# Patient Record
Sex: Female | Born: 1950 | Race: Black or African American | Hispanic: No | Smoking: Never smoker
Health system: Southern US, Community
[De-identification: ages and names within clinical notes are randomized; demographics above are authoritative.]

## PROBLEM LIST (undated history)

## (undated) DIAGNOSIS — I10 Essential (primary) hypertension: Secondary | ICD-10-CM

## (undated) DIAGNOSIS — M1990 Unspecified osteoarthritis, unspecified site: Secondary | ICD-10-CM

## (undated) DIAGNOSIS — M797 Fibromyalgia: Secondary | ICD-10-CM

## (undated) DIAGNOSIS — K219 Gastro-esophageal reflux disease without esophagitis: Secondary | ICD-10-CM

## (undated) HISTORY — DX: Essential (primary) hypertension: I10

## (undated) HISTORY — DX: Fibromyalgia: M79.7

## (undated) HISTORY — DX: Unspecified osteoarthritis, unspecified site: M19.90

## (undated) HISTORY — DX: Gastro-esophageal reflux disease without esophagitis: K21.9

---

## 2013-08-15 ENCOUNTER — Ambulatory Visit (INDEPENDENT_AMBULATORY_CARE_PROVIDER_SITE_OTHER): Payer: 59 | Admitting: Adult Health

## 2013-08-15 ENCOUNTER — Encounter: Payer: Self-pay | Admitting: Adult Health

## 2013-08-15 VITALS — BP 108/68 | HR 87 | Temp 98.5°F | Resp 14 | Ht 66.3 in | Wt 138.0 lb

## 2013-08-15 DIAGNOSIS — K219 Gastro-esophageal reflux disease without esophagitis: Secondary | ICD-10-CM

## 2013-08-15 DIAGNOSIS — L819 Disorder of pigmentation, unspecified: Secondary | ICD-10-CM | POA: Insufficient documentation

## 2013-08-15 DIAGNOSIS — I10 Essential (primary) hypertension: Secondary | ICD-10-CM

## 2013-08-15 MED ORDER — LOSARTAN POTASSIUM 50 MG PO TABS: 50.0000 mg | ORAL_TABLET | Freq: Every day | ORAL | Status: DC

## 2013-08-15 MED ORDER — AMLODIPINE BESYLATE 5 MG PO TABS: 5.0000 mg | ORAL_TABLET | Freq: Every day | ORAL | Status: DC

## 2013-08-15 MED ORDER — TRAZODONE HCL 50 MG PO TABS: 50.0000 mg | ORAL_TABLET | Freq: Every day | ORAL | Status: DC

## 2013-08-15 NOTE — Progress Notes (Signed)
Patient ID: Colleen Hogan, female   DOB: January 03, 1951, 63 y.o.   MRN: [id number]    Subjective:    Patient ID: Colleen Hogan, female    DOB: January 03, 1951, 63 y.o.   MRN: [id number]  HPI  Pt is a 63 y/o female who presents to clinic to establish care. She was followed by Dr. Alison MurrayAndreassi with [facility]. Will request records. Pt has an area on her right elbow that is dry and dark. She has been applying lotion but does not improve. She reports some itchiness in the area. Skin is intact.  Mammogram - 2014 [facility] PAP - 2010 Colonoscopy - Has never had one done and does not want one.    Past Medical History  Diagnosis Date  . Arthritis   . GERD (gastroesophageal reflux disease)   . Hypertension   . Fibromyalgia      History reviewed. No pertinent past surgical history.   Family History  Problem Relation Age of Onset  . Cancer Mother     Breast cancer  . Hypertension Mother   . Kidney disease Mother     Dialysis x 20 years  . Alcohol abuse Father   . Arthritis Father      History   Social History  . Marital Status: Divorced    Spouse Name: N/A    Number of Children: 1  . Years of Education: 3318   Occupational History  . 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 Smoking status: Never Smoker   . Smokeless tobacco: Not on file  . Alcohol Use: Yes     Comment: Rarely  . Drug Use: No  . Sexual Activity: Not on file   Other Topics Concern  . Not on file   Social History Narrative   Colleen Hogan grew up in [place], [place]. She is divorced and has one son, Christiane HaJonathan, and a granddaughter, Dorathy DaftKayla. They live in [place]. She lives in [place] with her significant other, Jule Economyndre Akins. They have a dog named Barnie. 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 She enjoys oil paint by numbers, reading, cooking.      Exercise - Elliptical    Caffeine - 1 cup of coffee in the morning                    1.5 glasses of ice tea daily          Review of Systems  Constitutional: Negative.   HENT: Negative.   Eyes: Negative.   Respiratory: Negative.   Cardiovascular: Negative.   Gastrointestinal: Negative.   Endocrine: Negative.   Genitourinary: Negative.   Musculoskeletal: Negative.   Skin:       Right elbow skin discoloration  Allergic/Immunologic: Negative.   Neurological: Negative.   Hematological: Negative.   Psychiatric/Behavioral: Negative.        Objective:     Physical Exam  Constitutional: She is oriented to person, place, and time. No distress.  HENT:  Head: Normocephalic and atraumatic.  Eyes: Conjunctivae and EOM are normal.  Neck: Normal range of motion. Neck supple.  Cardiovascular: Normal rate, regular rhythm, normal heart sounds and intact distal pulses.  Exam reveals no gallop and no friction rub.   No murmur heard. Pulmonary/Chest: Effort normal and breath sounds normal. No respiratory distress. She has no wheezes. She has no rales.  Musculoskeletal: Normal range of motion.  Neurological: She is alert and  oriented to person, place, and time. She has normal reflexes. Coordination normal.  Skin: Skin is warm and dry.  Right elbow skin dry, dark patch.   Psychiatric: She has a normal mood and affect. Her behavior is normal. Judgment and thought content normal.       Assessment & Plan:   1. HTN (hypertension) Well controlled on medication. Continue to follow  2. GERD (gastroesophageal reflux disease) Take TUMS or rolaids with good results. Symptoms have improved significantly since she retired.  3. Discoloration of skin Refer to Dermatology to further evaluate. - Ambulatory referral to Dermatology

## 2013-08-15 NOTE — Patient Instructions (Signed)
  Thank you for choosing [facility] for your health care needs.  Please remember to activate your MyChart account. The activation code is located at the end of this form.  I am referring you to Dermatology for your right elbow.  Please schedule your physical exam at your earliest convenience.

## 2013-08-15 NOTE — Progress Notes (Signed)
Pre visit review using our clinic review tool, if applicable. No additional management support is needed unless otherwise documented below in the visit note. 

## 2013-08-16 ENCOUNTER — Telehealth: Payer: Self-pay | Admitting: Adult Health

## 2013-08-16 NOTE — Telephone Encounter (Signed)
Relevant patient education assigned to patient using Emmi. ° °

## 2013-08-19 ENCOUNTER — Encounter: Payer: Self-pay | Admitting: Emergency Medicine

## 2013-09-03 ENCOUNTER — Ambulatory Visit (INDEPENDENT_AMBULATORY_CARE_PROVIDER_SITE_OTHER): Payer: 59 | Admitting: Adult Health

## 2013-09-03 ENCOUNTER — Encounter: Payer: Self-pay | Admitting: Adult Health

## 2013-09-03 VITALS — BP 110/70 | HR 81 | Temp 98.5°F | Resp 12 | Ht 65.75 in | Wt 137.5 lb

## 2013-09-03 DIAGNOSIS — Z1239 Encounter for other screening for malignant neoplasm of breast: Secondary | ICD-10-CM

## 2013-09-03 DIAGNOSIS — Z13820 Encounter for screening for osteoporosis: Secondary | ICD-10-CM

## 2013-09-03 DIAGNOSIS — Z1211 Encounter for screening for malignant neoplasm of colon: Secondary | ICD-10-CM | POA: Insufficient documentation

## 2013-09-03 DIAGNOSIS — Z0000 Encounter for general adult medical examination without abnormal findings: Secondary | ICD-10-CM

## 2013-09-03 DIAGNOSIS — Z23 Encounter for immunization: Secondary | ICD-10-CM | POA: Insufficient documentation

## 2013-09-03 LAB — LIPID PANEL
Cholesterol: 227 mg/dL — ABNORMAL HIGH (ref 0–200)
HDL: 62.6 mg/dL (ref >39.00)
LDL Cholesterol: 142 mg/dL — ABNORMAL HIGH (ref 0–99)
TRIGLYCERIDES: 111 mg/dL (ref 0.0–149.0)
Total CHOL/HDL Ratio: 4
VLDL: 22.2 mg/dL (ref 0.0–40.0)

## 2013-09-03 LAB — CBC WITH DIFFERENTIAL/PLATELET
BASOS ABS: 0 10*3/uL (ref 0.0–0.1)
Basophils Relative: 0.6 % (ref 0.0–3.0)
Eosinophils Absolute: 0.1 10*3/uL (ref 0.0–0.7)
Eosinophils Relative: 2.1 % (ref 0.0–5.0)
HEMATOCRIT: 36.3 % (ref 36.0–46.0)
Hemoglobin: 12.2 g/dL (ref 12.0–15.0)
LYMPHS ABS: 1.3 10*3/uL (ref 0.7–4.0)
LYMPHS PCT: 19.2 % (ref 12.0–46.0)
MCHC: 33.6 g/dL (ref 30.0–36.0)
MCV: 82.5 fl (ref 78.0–100.0)
MONOS PCT: 7.2 % (ref 3.0–12.0)
Monocytes Absolute: 0.5 10*3/uL (ref 0.1–1.0)
NEUTROS PCT: 70.9 % (ref 43.0–77.0)
Neutro Abs: 4.9 10*3/uL (ref 1.4–7.7)
PLATELETS: 297 10*3/uL (ref 150.0–400.0)
RBC: 4.4 Mil/uL (ref 3.87–5.11)
RDW: 14.4 % (ref 11.5–15.5)
WBC: 6.9 10*3/uL (ref 4.0–10.5)

## 2013-09-03 LAB — BASIC METABOLIC PANEL
BUN: 22 mg/dL (ref 6–23)
CALCIUM: 9.7 mg/dL (ref 8.4–10.5)
CO2: 29 meq/L (ref 19–32)
CREATININE: 0.8 mg/dL (ref 0.4–1.2)
Chloride: 104 mEq/L (ref 96–112)
GFR: 76.47 mL/min (ref >60.00)
GLUCOSE: 98 mg/dL (ref 70–99)
Potassium: 4.9 mEq/L (ref 3.5–5.1)
Sodium: 141 mEq/L (ref 135–145)

## 2013-09-03 LAB — HEPATIC FUNCTION PANEL
ALBUMIN: 4.1 g/dL (ref 3.5–5.2)
ALK PHOS: 87 U/L (ref 39–117)
ALT: 24 U/L (ref 0–35)
AST: 20 U/L (ref 0–37)
Bilirubin, Direct: 0.1 mg/dL (ref 0.0–0.3)
Total Bilirubin: 0.5 mg/dL (ref 0.2–1.2)
Total Protein: 7.3 g/dL (ref 6.0–8.3)

## 2013-09-03 NOTE — Progress Notes (Signed)
Subjective:    Patient ID: Colleen Hogan, female    DOB: 04-Nov-1950, 63 y.o.   MRN: [id number]  HPI Patient is a pleasant 63 year old female who presents to clinic for her yearly physical including breast exam. She will not require a Pap this visit. She is feeling well overall.   Past Medical History  Diagnosis Date  . Arthritis   . GERD (gastroesophageal reflux disease)   . Hypertension   . Fibromyalgia     No past surgical history on file.   Family History  Problem Relation Age of Onset  . Cancer Mother     Breast cancer  . Hypertension Mother   . Kidney disease Mother     Dialysis x 20 years  . Alcohol abuse Father   . Arthritis Father      History   Social History  . Marital Status: Divorced    Spouse Name: N/A    Number of Children: 1  . Years of Education: 1718   Occupational History  . 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 Smoking status: Never Smoker   . Smokeless tobacco: Not on file  . Alcohol Use: Yes     Comment: Rarely  . Drug Use: No  . Sexual Activity: Not on file   Other Topics Concern  . Not on file   Social History Narrative   Aurther Lofterry grew up in [place], [place]. She is divorced and has one son, Christiane HaJonathan, and a granddaughter, Dorathy DaftKayla. They live in [place]. She lives in [place] with her significant other, Jule Economyndre Akins. They have a dog named Barnie. 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 She enjoys oil paint by numbers, reading, cooking.      Exercise - Elliptical    Caffeine - 1 cup of coffee in the morning                    1.5 glasses of ice tea daily    Review of Systems  Constitutional: Negative.   HENT: Negative.   Eyes: Negative.   Respiratory: Negative.   Cardiovascular: Negative.   Gastrointestinal: Negative.   Endocrine: Negative.   Genitourinary: Negative.   Musculoskeletal: Negative.   Skin: Negative.   Allergic/Immunologic: Negative.   Neurological:  Negative.   Hematological: Negative.   Psychiatric/Behavioral: Negative.        Objective:   Physical Exam  Constitutional: She is oriented to person, place, and time. She appears well-developed and well-nourished. No distress.  HENT:  Head: Normocephalic and atraumatic.  Right Ear: External ear normal.  Left Ear: External ear normal.  Nose: Nose normal.  Mouth/Throat: Oropharynx is clear and moist.  Eyes: Conjunctivae and EOM are normal. Pupils are equal, round, and reactive to light.  Neck: Normal range of motion. Neck supple. No tracheal deviation present. No thyromegaly present.  Cardiovascular: Normal rate, regular rhythm, S1 normal, S2 normal, normal heart sounds, intact distal pulses and normal pulses.   No extrasystoles are present. Exam reveals no gallop and no friction rub.   No murmur heard. Pulmonary/Chest: Effort normal and breath sounds normal. No respiratory distress. She has no wheezes. She has no rhonchi. She has no rales. Right breast exhibits no inverted nipple, no mass, no nipple discharge, no skin change and no tenderness. Left breast exhibits no inverted nipple, no mass, no nipple discharge, no skin change and no tenderness. Breasts are symmetrical.  Abdominal: Soft. Bowel sounds are normal. She exhibits no distension and no mass. There is no tenderness. There is no rebound and no guarding.  Musculoskeletal: Normal range of motion. She exhibits no edema and no tenderness.  Lymphadenopathy:    She has no cervical adenopathy.  Neurological: She is alert and oriented to person, place, and time. She has normal strength. She displays no atrophy and no tremor. No cranial nerve deficit or sensory deficit. She exhibits normal muscle tone. She displays no seizure activity. Coordination and gait normal. She displays no Babinski's sign on the right side. She displays no Babinski's sign on the left side.  Reflex Scores:      Tricep reflexes are 2+ on the right side and 2+ on the  left side.      Bicep reflexes are 2+ on the right side and 2+ on the left side.      Brachioradialis reflexes are 2+ on the right side and 2+ on the left side.      Patellar reflexes are 2+ on the right side and 2+ on the left side.      Achilles reflexes are 2+ on the right side and 2+ on the left side. Skin: Skin is warm and dry.  Psychiatric: She has a normal mood and affect. Her behavior is normal. Judgment and thought content normal.      Assessment & Plan:   1. Routine general medical examination at a health care facility Normal physical exam including breast examination. All screenings addressed. Check routine labs. - CBC with Differential - Lipid panel - Basic metabolic panel - Hepatic function panel  2. Screening for breast cancer Mammogram order. All normal previously. Reports of dense breast tissue. Mammograms done at [facility]. - MM DIGITAL SCREENING BILATERAL; Future  3. Screen for colon cancer Discussed colonoscopy but pt does not wish to have one done. Check stool for occult blood. - Fecal occult blood, imunochemical  4. Screening for osteoporosis Hx of osteoporosis in the past. Has been on fosamax although did not tolerate medication 2/2 GI issues. Following bone density was improved and this was 3 years ago. Will send for repeat bone density. - DG Bone Density; Future  5. Need for shingles vaccine Discussed shingles vaccine. Pt does not wish to have this vaccine. She will advise if she changes her mind.

## 2013-09-03 NOTE — Patient Instructions (Signed)
  You had your yearly physical today.  Please have labs drawn prior to leaving the office. We will contact you with results once they're available.  I am ordering a stool sample to check for occult blood. Please return this sample at your earliest convenience.  We will schedule your Mammogram at [facility] and call you with an appointment.  I have also ordered a bone density scan. We will also call you with this appointment.  Return for your physical exam in 1 year or sooner if necessary.

## 2013-09-03 NOTE — Progress Notes (Signed)
Pre visit review using our clinic review tool, if applicable. No additional management support is needed unless otherwise documented below in the visit note. 

## 2013-09-15 ENCOUNTER — Encounter: Payer: Self-pay | Admitting: Adult Health

## 2013-09-19 ENCOUNTER — Other Ambulatory Visit (INDEPENDENT_AMBULATORY_CARE_PROVIDER_SITE_OTHER): Payer: 59

## 2013-09-19 ENCOUNTER — Other Ambulatory Visit: Payer: Self-pay | Admitting: Adult Health

## 2013-09-19 DIAGNOSIS — Z0000 Encounter for general adult medical examination without abnormal findings: Secondary | ICD-10-CM

## 2013-09-19 DIAGNOSIS — Z1211 Encounter for screening for malignant neoplasm of colon: Secondary | ICD-10-CM

## 2013-09-19 LAB — FECAL OCCULT BLOOD, IMMUNOCHEMICAL: Fecal Occult Bld: NEGATIVE

## 2013-09-20 ENCOUNTER — Encounter: Payer: Self-pay | Admitting: *Deleted

## 2013-09-20 ENCOUNTER — Encounter: Payer: Self-pay | Admitting: Adult Health

## 2013-10-16 ENCOUNTER — Encounter: Payer: Self-pay | Admitting: Adult Health

## 2013-11-04 ENCOUNTER — Ambulatory Visit: Payer: Self-pay | Admitting: Adult Health

## 2013-11-04 LAB — HM MAMMOGRAPHY: HM Mammogram: NORMAL

## 2013-11-07 ENCOUNTER — Ambulatory Visit: Payer: Self-pay | Admitting: Adult Health

## 2013-11-07 LAB — HM DEXA SCAN

## 2013-11-18 ENCOUNTER — Encounter: Payer: Self-pay | Admitting: Adult Health

## 2013-11-20 ENCOUNTER — Encounter: Payer: Self-pay | Admitting: Adult Health

## 2013-11-22 ENCOUNTER — Encounter: Payer: Self-pay | Admitting: *Deleted

## 2013-11-25 ENCOUNTER — Other Ambulatory Visit: Payer: Self-pay | Admitting: Adult Health

## 2013-11-25 MED ORDER — ALENDRONATE SODIUM 70 MG PO TABS: 70.0000 mg | ORAL_TABLET | ORAL | Status: DC

## 2013-11-25 NOTE — Progress Notes (Signed)
Bone scan shows osteoporosis. Start fosamax. Repeat in 1 year.

## 2014-08-19 ENCOUNTER — Encounter: Payer: Self-pay | Admitting: Nurse Practitioner

## 2014-09-09 ENCOUNTER — Ambulatory Visit (INDEPENDENT_AMBULATORY_CARE_PROVIDER_SITE_OTHER): Payer: 59 | Admitting: Nurse Practitioner

## 2014-09-09 ENCOUNTER — Encounter: Payer: Self-pay | Admitting: Nurse Practitioner

## 2014-09-09 VITALS — BP 138/88 | HR 97 | Temp 98.5°F | Resp 12 | Ht 65.75 in | Wt 139.1 lb

## 2014-09-09 DIAGNOSIS — I10 Essential (primary) hypertension: Secondary | ICD-10-CM | POA: Diagnosis not present

## 2014-09-09 DIAGNOSIS — Z131 Encounter for screening for diabetes mellitus: Secondary | ICD-10-CM | POA: Diagnosis not present

## 2014-09-09 DIAGNOSIS — Z13220 Encounter for screening for lipoid disorders: Secondary | ICD-10-CM | POA: Diagnosis not present

## 2014-09-09 DIAGNOSIS — Z0000 Encounter for general adult medical examination without abnormal findings: Secondary | ICD-10-CM

## 2014-09-09 DIAGNOSIS — Z130 Encounter for screening for diseases of the blood and blood-forming organs and certain disorders involving the immune mechanism: Secondary | ICD-10-CM | POA: Diagnosis not present

## 2014-09-09 DIAGNOSIS — G479 Sleep disorder, unspecified: Secondary | ICD-10-CM

## 2014-09-09 MED ORDER — AMLODIPINE BESYLATE 5 MG PO TABS: 5.0000 mg | ORAL_TABLET | Freq: Every day | ORAL | Status: DC

## 2014-09-09 MED ORDER — LOSARTAN POTASSIUM 50 MG PO TABS
50.0000 mg | ORAL_TABLET | Freq: Every day | ORAL | Status: DC
Start: 2014-09-09 — End: 2015-07-14

## 2014-09-09 MED ORDER — TRAZODONE HCL 50 MG PO TABS: 50.0000 mg | ORAL_TABLET | Freq: Every day | ORAL | Status: DC

## 2014-09-09 NOTE — Progress Notes (Addendum)
Subjective:    Patient ID: Colleen Hogan, female    DOB: 1950-11-26, 64 y.o.   MRN: [id number]  HPI  Colleen Hogan is a 64 yo female with a CC of medication management.   1) Up date Health info:   Mammogram- Aug. 2015   Pap- Has GYN  Colonoscopy- I FOBT last year was negative  Eye Exam- 3 yrs  Dental Exam- 3 yrs  Mother had breast cancer with double mastectomy dx at 62 yo   Norvasc, Cozaar- No recent labs  Trazadone- Was taking every night, off for 3 weeks couldn't go to bed til 5 am.   Review of Systems  Constitutional: Negative for fever, chills, diaphoresis and fatigue.  Respiratory: Negative for chest tightness, shortness of breath and wheezing.   Cardiovascular: Negative for chest pain, palpitations and leg swelling.  Gastrointestinal: Negative for nausea, vomiting and diarrhea.  Skin: Negative for rash.  Neurological: Negative for dizziness, weakness, numbness and headaches.  Psychiatric/Behavioral: Positive for sleep disturbance. Negative for suicidal ideas. The patient is not nervous/anxious.    Past Medical History  Diagnosis Date  . Arthritis   . GERD (gastroesophageal reflux disease)   . Hypertension   . Fibromyalgia     History   Social History  . Marital Status: Divorced    Spouse Name: N/A  . Number of Children: 1  . Years of Education: 6918   Occupational History  . 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 Smoking status: Never Smoker   . Smokeless tobacco: Not on file  . Alcohol Use: Yes     Comment: Rarely  . Drug Use: No  . Sexual Activity: Not on file   Other Topics Concern  . Not on file   Social History Narrative   Colleen Hogan grew up in [place], [place]. She is divorced and has one son, Colleen Hogan, and a granddaughter, Colleen Hogan. They live in [place]. She lives in [place] with her significant other, Colleen Hogan. They have a dog named Barnie. 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 She  enjoys oil paint by numbers, reading, cooking.      Exercise - Elliptical    Caffeine - 1 cup of coffee in the morning                    1.5 glasses of ice tea daily    No past surgical history on file.  Family History  Problem Relation Age of Onset  . Cancer Mother     Breast cancer  . Hypertension Mother   . Kidney disease Mother     Dialysis x 20 years  . Alcohol abuse Father   . Arthritis Father     Allergies  Allergen Reactions  . Proton Pump Inhibitors Rash    Full body rash with Nexium   . Beta Adrenergic Blockers     Depression  . Penicillins   . Verapamil Hcl Er     Current Outpatient Prescriptions on File Prior to Visit  Medication Sig Dispense Refill  . calcium-vitamin D (OSCAL WITH D) 250-125 MG-UNIT per tablet Take 1 tablet by mouth daily.    . Multiple Vitamins-Minerals (MULTIVITAMIN WITH MINERALS) tablet Take 1 tablet by mouth daily.     No current facility-administered medications on file prior to visit.        Objective:   Physical Exam  Constitutional: She is oriented  to person, place, and time. She appears well-developed and well-nourished. No distress.  BP 138/88 mmHg  Pulse 97  Temp(Src) 98.5 F (36.9 C) (Oral)  Resp 12  Ht 5' 5.75" (1.67 m)  Wt 139 lb 1.9 oz (63.104 kg)  BMI 22.63 kg/m2  SpO2 97%   HENT:  Head: Normocephalic and atraumatic.  Right Ear: External ear normal.  Left Ear: External ear normal.  Eyes: EOM are normal. Pupils are equal, round, and reactive to light. Right eye exhibits no discharge. Left eye exhibits no discharge. No scleral icterus.  Neck: Normal range of motion. Neck supple.  Cardiovascular: Normal rate, regular rhythm and normal heart sounds.  Exam reveals no gallop and no friction rub.   No murmur heard. Pulmonary/Chest: Effort normal and breath sounds normal. No respiratory distress. She has no wheezes. She has no rales. She exhibits no tenderness.  Neurological: She is alert and oriented to person,  place, and time. No cranial nerve deficit. She exhibits normal muscle tone. Coordination normal.  Skin: Skin is warm and dry. No rash noted. She is not diaphoretic.  Psychiatric: She has a normal mood and affect. Her behavior is normal. Judgment and thought content normal.       Assessment & Plan:

## 2014-09-09 NOTE — Patient Instructions (Addendum)
Please make a future fasting Lab appointment. Nothing to eat or drink after midnight except water and black coffee.   Let us know if you need anything and see you next year.

## 2014-09-09 NOTE — Progress Notes (Signed)
Pre visit review using our clinic review tool, if applicable. No additional management support is needed unless otherwise documented below in the visit note. 

## 2014-09-14 DIAGNOSIS — G479 Sleep disorder, unspecified: Secondary | ICD-10-CM | POA: Insufficient documentation

## 2014-09-14 NOTE — Assessment & Plan Note (Signed)
Pt stable on Trazodone 50 mg at bedtime. Pt took off 3 weeks to see if she needed it anymore and could tell a big difference. Refill sent to pharmacy.

## 2014-09-14 NOTE — Assessment & Plan Note (Addendum)
Continue on current therapy- Norvasc 5 mg daily and Cozaar 50 mg daily. Refills sent to pharmacy. Obtain CMET.   BP Readings from Last 3 Encounters:  09/09/14 138/88  09/03/13 110/70  08/15/13 108/68

## 2014-10-14 ENCOUNTER — Other Ambulatory Visit (INDEPENDENT_AMBULATORY_CARE_PROVIDER_SITE_OTHER): Payer: 59

## 2014-10-14 DIAGNOSIS — I10 Essential (primary) hypertension: Secondary | ICD-10-CM

## 2014-10-14 DIAGNOSIS — Z130 Encounter for screening for diseases of the blood and blood-forming organs and certain disorders involving the immune mechanism: Secondary | ICD-10-CM

## 2014-10-14 DIAGNOSIS — Z13220 Encounter for screening for lipoid disorders: Secondary | ICD-10-CM

## 2014-10-14 DIAGNOSIS — Z131 Encounter for screening for diabetes mellitus: Secondary | ICD-10-CM | POA: Diagnosis not present

## 2014-10-14 LAB — COMPREHENSIVE METABOLIC PANEL
ALBUMIN: 4.4 g/dL (ref 3.5–5.2)
ALK PHOS: 88 U/L (ref 39–117)
ALT: 19 U/L (ref 0–35)
AST: 19 U/L (ref 0–37)
BUN: 22 mg/dL (ref 6–23)
CHLORIDE: 104 meq/L (ref 96–112)
CO2: 28 mEq/L (ref 19–32)
Calcium: 9.5 mg/dL (ref 8.4–10.5)
Creatinine, Ser: 0.83 mg/dL (ref 0.40–1.20)
GFR: 74.07 mL/min (ref >60.00)
Glucose, Bld: 100 mg/dL — ABNORMAL HIGH (ref 70–99)
POTASSIUM: 4.4 meq/L (ref 3.5–5.1)
Sodium: 137 mEq/L (ref 135–145)
TOTAL PROTEIN: 7.5 g/dL (ref 6.0–8.3)
Total Bilirubin: 0.5 mg/dL (ref 0.2–1.2)

## 2014-10-14 LAB — LIPID PANEL
CHOL/HDL RATIO: 4
Cholesterol: 225 mg/dL — ABNORMAL HIGH (ref 0–200)
HDL: 61.2 mg/dL (ref >39.00)
LDL CALC: 131 mg/dL — AB (ref 0–99)
NONHDL: 163.8
TRIGLYCERIDES: 163 mg/dL — AB (ref 0.0–149.0)
VLDL: 32.6 mg/dL (ref 0.0–40.0)

## 2014-10-14 LAB — HEMOGLOBIN A1C: HEMOGLOBIN A1C: 5.3 % (ref 4.6–6.5)

## 2014-10-14 LAB — CBC WITH DIFFERENTIAL/PLATELET
BASOS ABS: 0 10*3/uL (ref 0.0–0.1)
Basophils Relative: 0.5 % (ref 0.0–3.0)
EOS PCT: 2.6 % (ref 0.0–5.0)
Eosinophils Absolute: 0.2 10*3/uL (ref 0.0–0.7)
HEMATOCRIT: 36.7 % (ref 36.0–46.0)
HEMOGLOBIN: 12.6 g/dL (ref 12.0–15.0)
LYMPHS PCT: 20.1 % (ref 12.0–46.0)
Lymphs Abs: 1.3 10*3/uL (ref 0.7–4.0)
MCHC: 34.2 g/dL (ref 30.0–36.0)
MCV: 80.1 fl (ref 78.0–100.0)
Monocytes Absolute: 0.4 10*3/uL (ref 0.1–1.0)
Monocytes Relative: 6.2 % (ref 3.0–12.0)
Neutro Abs: 4.5 10*3/uL (ref 1.4–7.7)
Neutrophils Relative %: 70.6 % (ref 43.0–77.0)
Platelets: 297 10*3/uL (ref 150.0–400.0)
RBC: 4.59 Mil/uL (ref 3.87–5.11)
RDW: 14.7 % (ref 11.5–15.5)
WBC: 6.4 10*3/uL (ref 4.0–10.5)

## 2014-12-01 ENCOUNTER — Telehealth: Payer: 59 | Admitting: Family

## 2014-12-01 DIAGNOSIS — J019 Acute sinusitis, unspecified: Secondary | ICD-10-CM

## 2014-12-01 MED ORDER — DOXYCYCLINE HYCLATE 100 MG PO TABS: 100.0000 mg | ORAL_TABLET | Freq: Two times a day (BID) | ORAL | Status: DC

## 2014-12-01 NOTE — Progress Notes (Signed)
We are sorry that you are not feeling well.  Here is how we plan to help!  Based on what you have shared with me it looks like you have sinusitis.  Sinusitis is inflammation and infection in the sinus cavities of the head.  Based on your presentation I believe you most likely have Acute Bacterial sinusitis.  This is an infection caused by bacteria and is treated with antibiotics.  I have prescribed Doxycycline 100mg by mouth twice a day for 10 days.. You may use an oral decongestant such as Mucinex D or if you have glaucoma or high blood pressure use plain Mucinex.  Saline nasal sprays help and can safely be used as often as needed for congestion. If you develop worsening sinus pain, fever or notice severe headache and vision changes, or if symptoms are not better after completion of antibiotic, please schedule an appointment with a health care provider.  Sinus infections are not as easily transmitted as other respiratory infection, however we still recommend that you avoid close contact with loved ones, especially the very young and elderly.  Remember to wash your hands thoroughly throughout the day as this is the number one way to prevent the spread of infection!  Home Care:  Only take medications as instructed by your medical team.  Complete the entire course of an antibiotic.  Do not take these medications with alcohol.  A steam or ultrasonic humidifier can help congestion.  You can place a towel over your head and breathe in the steam from hot water coming from a faucet.  Avoid close contacts especially the very young and the elderly.  Cover your mouth when you cough or sneeze.  Always remember to wash your hands.  Get Help Right Away If:  You develop worsening fever or sinus pain.  You develop a severe head ache or visual changes.  Your symptoms persist after you have completed your treatment plan.  Make sure you  Understand these instructions.  Will watch your  condition.  Will get help right away if you are not doing well or get worse.  Your e-visit answers were reviewed by a board certified advanced clinical practitioner to complete your personal care plan.  Depending on the condition, your plan could have included both over the counter or prescription medications.  If there is a problem please reply  once you have received a response from your provider.  Your safety is important to us.  If you have drug allergies check your prescription carefully.    You can use MyChart to ask questions about today's visit, request a non-urgent call back, or ask for a work or school excuse.  You will get an e-mail in the next two days asking about your experience.  I hope that your e-visit has been valuable and will speed your recovery. Thank you for using e-visits.    

## 2015-07-11 ENCOUNTER — Telehealth: Payer: Self-pay | Admitting: Physician Assistant

## 2015-07-11 ENCOUNTER — Encounter (HOSPITAL_COMMUNITY): Payer: Self-pay | Admitting: Emergency Medicine

## 2015-07-11 ENCOUNTER — Emergency Department (HOSPITAL_COMMUNITY)
Admission: EM | Admit: 2015-07-11 | Discharge: 2015-07-11 | Disposition: A | Discharge status: Home / Self Care | Payer: BLUE CROSS/BLUE SHIELD | Source: Home / Self Care

## 2015-07-11 DIAGNOSIS — T783XXA Angioneurotic edema, initial encounter: Secondary | ICD-10-CM

## 2015-07-11 DIAGNOSIS — R21 Rash and other nonspecific skin eruption: Secondary | ICD-10-CM

## 2015-07-11 DIAGNOSIS — L509 Urticaria, unspecified: Secondary | ICD-10-CM

## 2015-07-11 DIAGNOSIS — R220 Localized swelling, mass and lump, head: Secondary | ICD-10-CM

## 2015-07-11 DIAGNOSIS — T7840XA Allergy, unspecified, initial encounter: Secondary | ICD-10-CM

## 2015-07-11 DIAGNOSIS — Z91018 Allergy to other foods: Secondary | ICD-10-CM

## 2015-07-11 MED ORDER — METHYLPREDNISOLONE SODIUM SUCC 125 MG IJ SOLR
125.0000 mg | Freq: Once | INTRAMUSCULAR | Status: AC
  Administered 2015-07-11: 125 mg via INTRAMUSCULAR

## 2015-07-11 MED ORDER — PREDNISONE 20 MG PO TABS: 60.0000 mg | ORAL_TABLET | Freq: Every day | ORAL | Status: DC

## 2015-07-11 MED ORDER — METHYLPREDNISOLONE SODIUM SUCC 125 MG IJ SOLR
INTRAMUSCULAR | Status: AC
  Filled 2015-07-11: qty 2

## 2015-07-11 MED ORDER — DIPHENHYDRAMINE HCL 50 MG/ML IJ SOLN
50.0000 mg | Freq: Once | INTRAMUSCULAR | Status: AC
  Administered 2015-07-11: 50 mg via INTRAMUSCULAR

## 2015-07-11 MED ORDER — EPINEPHRINE 0.3 MG/0.3ML IJ SOAJ: 0.3000 mg | Freq: Once | INTRAMUSCULAR | Status: DC

## 2015-07-11 MED ORDER — DIPHENHYDRAMINE HCL 50 MG/ML IJ SOLN
INTRAMUSCULAR | Status: AC
  Filled 2015-07-11: qty 1

## 2015-07-11 NOTE — Progress Notes (Signed)
Based on what you shared with me it looks like you have a serious condition that should be evaluated in a face to face office visit. You have had a serious reaction to fish. You need assessment and a steroid shot in addition to an oral steroid taper. Please go to an Urgent Care or ER immediately for assessment.    If you are having a true medical emergency please call 911.  If you need an urgent face to face visit, [facility] has four urgent care centers for your convenience.  Tressie Ellis. [facility]  [phone] Get Driving Directions Find a Provider at this Location  [address] [place], [place] [address] . 8 am to 8 pm Monday-Friday . 9 am to 7 pm Saturday-Sunday  . [facility]  [phone] Get Driving Directions Find a Provider at this Location  [address] [place], [place] [address] . 8 am to 8 pm Monday-Friday . 9 am to 6 pm Saturday . 11 am to 6 pm Sunday   . [facility] at [facility] [place]  [phone] Get Driving Directions  [address] [place], [place] [address] . 8 am to 8 pm Monday-Friday . 9 am to 4 pm Saturday-Sunday   . [facility] (a walk in primary care provider)  [phone]  Get Driving Directions Find a Provider at this Location  [address] [place], [place] [address] . 8 am to 8:30 pm Monday-Thursday . 8 am to 6 pm Friday . 8 am to 4 pm Saturday-Sunday   Your e-visit answers were reviewed by a board certified advanced clinical practitioner to complete your personal care plan.  Thank you for using e-Visits.

## 2015-07-11 NOTE — Discharge Instructions (Signed)
Call 911 if symptoms worsen or reoccur.  Particularly if swelling of tongue and difficulty breathing occur.    Angioedema Angioedema is a sudden swelling of tissues, often of the skin. It can occur on the face or genitals or in the abdomen or other body parts. The swelling usually develops over a short period and gets better in 24 to 48 hours. It often begins during the night and is found when the person wakes up. The person may also get red, itchy patches of skin (hives). Angioedema can be dangerous if it involves swelling of the air passages.  Depending on the cause, episodes of angioedema may only happen once, come back in unpredictable patterns, or repeat for several years and then gradually fade away.  CAUSES  Angioedema can be caused by an allergic reaction to various triggers. It can also result from nonallergic causes, including reactions to drugs, immune system disorders, viral infections, or an abnormal gene that is passed to you from your parents (hereditary). For some people with angioedema, the cause is unknown.  Some things that can trigger angioedema include:   Foods.   Medicines, such as ACE inhibitors, ARBs, nonsteroidal anti-inflammatory agents, or estrogen.   Latex.   Animal saliva.   Insect stings.   Dyes used in X-rays.   Mild injury.   Dental work.  Surgery.  Stress.   Sudden changes in temperature.   Exercise. SIGNS AND SYMPTOMS   Swelling of the skin.  Hives. If these are present, there is also intense itching.  Redness in the affected area.   Pain in the affected area.  Swollen lips or tongue.  Breathing problems. This may happen if the air passages swell.  Wheezing. If internal organs are involved, there may be:   Nausea.   Abdominal pain.   Vomiting.   Difficulty swallowing.   Difficulty passing urine. DIAGNOSIS   Your health care provider will examine the affected area and take a medical and family  history.  Various tests may be done to help determine the cause. Tests may include:  Allergy skin tests to see if the problem is an allergic reaction.   Blood tests to check for hereditary angioedema.   Tests to check for underlying diseases that could cause the condition.   A review of your medicines, including over-the-counter medicines, may be done. TREATMENT  Treatment will depend on the cause of the angioedema. Possible treatments include:   Removal of anything that triggered the condition (such as stopping certain medicines).   Medicines to treat symptoms or prevent attacks. Medicines given may include:   Antihistamines.   Epinephrine injection.   Steroids.   Hospitalization may be required for severe attacks. If the air passages are affected, it can be an emergency. Tubes may need to be placed to keep the airway open. HOME CARE INSTRUCTIONS   Take all medicines as directed by your health care provider.  If you were given medicines for emergency allergy treatment, always carry them with you.  Wear a medical bracelet as directed by your health care provider.   Avoid known triggers. SEEK MEDICAL CARE IF:   You have repeat attacks of angioedema.   Your attacks are more frequent or more severe despite preventive measures.   You have hereditary angioedema and are considering having children. It is important to discuss with your health care provider the risks of passing the condition on to your children. SEEK IMMEDIATE MEDICAL CARE IF:   You have severe swelling of the  mouth, tongue, or lips.  You have difficulty breathing.   You have difficulty swallowing.   You faint. MAKE SURE YOU:  Understand these instructions.  Will watch your condition.  Will get help right away if you are not doing well or get worse.   This information is not intended to replace advice given to you by your health care provider. Make sure you discuss any questions you have  with your health care provider.   Document Released: 06/27/2001 Document Revised: 05/09/2014 Document Reviewed: 12/10/2012 Elsevier Interactive Patient Education [employer]2016 [employer]

## 2015-07-11 NOTE — ED Notes (Signed)
Pt here with possible anaphylactic reaction after eating Perch fish last night States swelling and hives started last night, took Benadryl and Ibuprofen for relief Lip swelling with hives, itching Refusing to ER transfer

## 2015-07-11 NOTE — ED Provider Notes (Signed)
CSN: [id number]     Arrival date & time 07/11/15  1833 History   None    Chief Complaint  Patient presents with  . Allergic Reaction   (Consider location/radiation/quality/duration/timing/severity/associated sxs/prior Treatment)  HPI   The patient is a 65 year old female presenting tonight with complaints of swelling of her face and lips and hives that are itchy. Patient states she ate some perch yesterday evening for dinner at about 6 PM and developed a problem with hives and itching shortly afterwards. The patient states that she took some ibuprofen and cold medicine that contains an antihistamine and slept well with no problem. Patient states she woke this morning and did not take any additional medication and by 11 AM she had "significant" swelling of the face and lips. In addition, the patient states that she had a "strange sensation" with her breathing and a chest "heaviness". The patient states that she took 50 mg of Benadryl and waited for the swelling to subside before coming in to be seen. Patient states that her lips were "5 times as large" as they are now. Patient denies tingling or swelling of her tongue. Denies difficulty breathing or shortness of breath at this time.   Past Medical History  Diagnosis Date  . Arthritis   . GERD (gastroesophageal reflux disease)   . Hypertension   . Fibromyalgia    History reviewed. No pertinent past surgical history. Family History  Problem Relation Age of Onset  . Cancer Mother     Breast cancer  . Hypertension Mother   . Kidney disease Mother     Dialysis x 20 years  . Alcohol abuse Father   . Arthritis Father    Social History  Substance Use Topics  . Smoking status: Never Smoker   . Smokeless tobacco: None  . Alcohol Use: Yes     Comment: Rarely   OB History    No data available     Review of Systems  Constitutional: Negative.  Negative for fever and fatigue.  HENT: Negative.   Eyes: Negative.  Negative for visual  disturbance.  Respiratory: Negative.  Negative for cough and shortness of breath.   Cardiovascular: Negative.  Negative for chest pain and leg swelling.  Gastrointestinal: Negative.   Endocrine: Negative.   Genitourinary: Negative.   Musculoskeletal: Negative.  Negative for gait problem and neck stiffness.  Skin: Positive for rash.  Allergic/Immunologic: Positive for food allergies.       Dates that "occasionally" if she has a "rather large tramp" she will have an allergic reaction with hives. Has never had a reaction to fish before.  Neurological: Negative.  Negative for dizziness and headaches.  Hematological: Negative.   Psychiatric/Behavioral: Negative.     Allergies  Proton pump inhibitors; Beta adrenergic blockers; Penicillins; and Verapamil hcl er  Home Medications   Prior to Admission medications   Medication Sig Start Date End Date Taking? Authorizing Provider  amLODipine (NORVASC) 5 MG tablet Take 1 tablet (5 mg total) by mouth daily. 09/09/14   Carollee Leitz, NP  calcium-vitamin D (OSCAL WITH D) 250-125 MG-UNIT per tablet Take 1 tablet by mouth daily.    Historical Provider, MD  doxycycline (VIBRA-TABS) 100 MG tablet Take 1 tablet (100 mg total) by mouth 2 (two) times daily. 12/01/14   Junie Spencer, FNP  EPINEPHrine 0.3 mg/0.3 mL IJ SOAJ injection Inject 0.3 mLs (0.3 mg total) into the muscle once. 07/11/15   Servando Salina, NP  losartan (COZAAR) 50 MG  tablet Take 1 tablet (50 mg total) by mouth daily. 09/09/14   Carollee Leitzarrie M Doss, NP  Multiple Vitamins-Minerals (MULTIVITAMIN WITH MINERALS) tablet Take 1 tablet by mouth daily.    Historical Provider, MD  predniSONE (DELTASONE) 20 MG tablet Take 3 tablets (60 mg total) by mouth daily. 07/11/15   Servando Salinaatherine H Rossi, NP  traZODone (DESYREL) 50 MG tablet Take 1 tablet (50 mg total) by mouth at bedtime. 09/09/14   Carollee Leitzarrie M Doss, NP   Meds Ordered and Administered this Visit   Medications  methylPREDNISolone sodium succinate  (SOLU-MEDROL) 125 mg/2 mL injection 125 mg (125 mg Intramuscular Given 07/11/15 2044)  diphenhydrAMINE (BENADRYL) injection 50 mg (50 mg Intramuscular Given 07/11/15 2044)    BP 135/73 mmHg  Pulse 94  Temp(Src) 98.5 F (36.9 C) (Oral)  SpO2 98% No data found.   Physical Exam  Constitutional: She appears well-developed and well-nourished. No distress.  HENT:  Head: Normocephalic and atraumatic.  Right Ear: External ear normal.  Left Ear: External ear normal.  Mouth/Throat: Oropharynx is clear and moist. No oropharyngeal exudate.  See attached images. No evidence of swelling of the oropharynx or uvula. Swelling still present particularly with upper lip and around the patient's eyes bilaterally.    Eyes: Pupils are equal, round, and reactive to light. Right eye exhibits no discharge. Left eye exhibits no discharge. No scleral icterus.  Neck: Normal range of motion. Neck supple. No tracheal deviation present.  Cardiovascular: Normal rate, regular rhythm, normal heart sounds and intact distal pulses.  Exam reveals no gallop and no friction rub.   No murmur heard. Pulmonary/Chest: Effort normal and breath sounds normal. No stridor. No respiratory distress. She has no wheezes. She has no rales. She exhibits no tenderness.  Lymphadenopathy:    She has no cervical adenopathy.  Skin: Skin is warm and dry. Rash noted. She is not diaphoretic. No erythema. No pallor.  See attached images.   Nursing note and vitals reviewed.          ED Course  Procedures (including critical care time)  Labs Review Labs Reviewed - No data to display  Imaging Review No results found.   MDM   1. Allergic reaction, initial encounter   2. Angioedema, initial encounter   3. Hives    Meds ordered this encounter  Medications  . predniSONE (DELTASONE) 20 MG tablet    Sig: Take 3 tablets (60 mg total) by mouth daily.    Dispense:  15 tablet    Refill:  0  . methylPREDNISolone sodium succinate  (SOLU-MEDROL) 125 mg/2 mL injection 125 mg    Sig:   . diphenhydrAMINE (BENADRYL) injection 50 mg    Sig:   . EPINEPHrine 0.3 mg/0.3 mL IJ SOAJ injection    Sig: Inject 0.3 mLs (0.3 mg total) into the muscle once.    Dispense:  1 Device    Refill:  3   The patient was advised of the risk of rebound to the medications and told that she should go to the emergency department for further evaluation and potential epinephrine administration. The patient refuses and was told she would need to sign out AMA should she choose not to remain for observation.  Patient was advised of the signs and symptoms of acute allergic reaction rebound and anaphylaxis and agrees to contact 911 should any of those symptoms manifest. The patient verbalizes understanding of plan of care.       Servando Salinaatherine H Rossi, NP 07/11/15 2129

## 2015-07-14 ENCOUNTER — Other Ambulatory Visit: Payer: Self-pay | Admitting: Nurse Practitioner

## 2015-07-15 NOTE — Telephone Encounter (Signed)
Last OV was 09/09/14.  Please advise?

## 2015-08-05 ENCOUNTER — Telehealth: Payer: Self-pay | Admitting: *Deleted

## 2015-08-05 NOTE — Telephone Encounter (Signed)
Pt requested a call back after her 07/12/15 e-Visit.  Left message for pt to call back

## 2015-08-29 ENCOUNTER — Other Ambulatory Visit: Payer: Self-pay | Admitting: Nurse Practitioner

## 2015-08-31 MED ORDER — AMLODIPINE BESYLATE 5 MG PO TABS: ORAL_TABLET | ORAL | Status: DC

## 2015-09-04 IMAGING — MG MM DIGITAL SCREENING BILAT W/ CAD
1 series · 5 of 5 positions shown · non-contrast
Comparison: None.

CLINICAL DATA: Screening.

EXAM:
DIGITAL SCREENING BILATERAL MAMMOGRAM WITH CAD

[R CC · right · 5 of 5 slices shown]
[im 1/5]
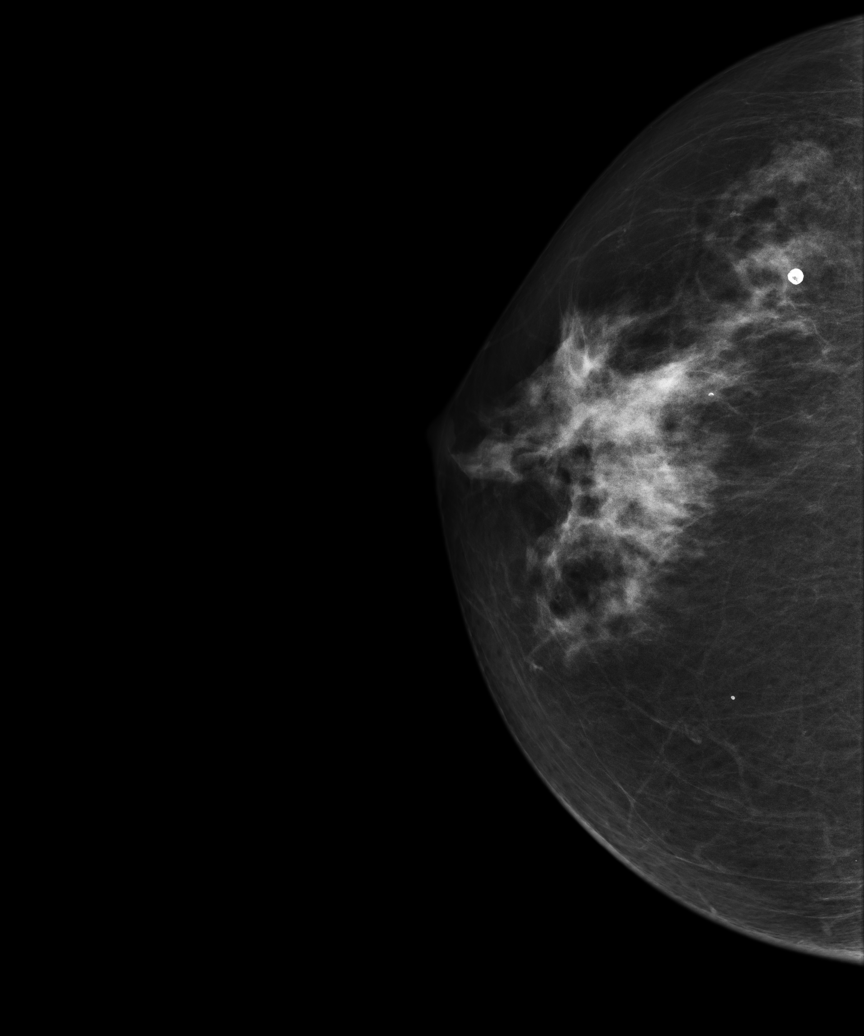
[im 2/5]
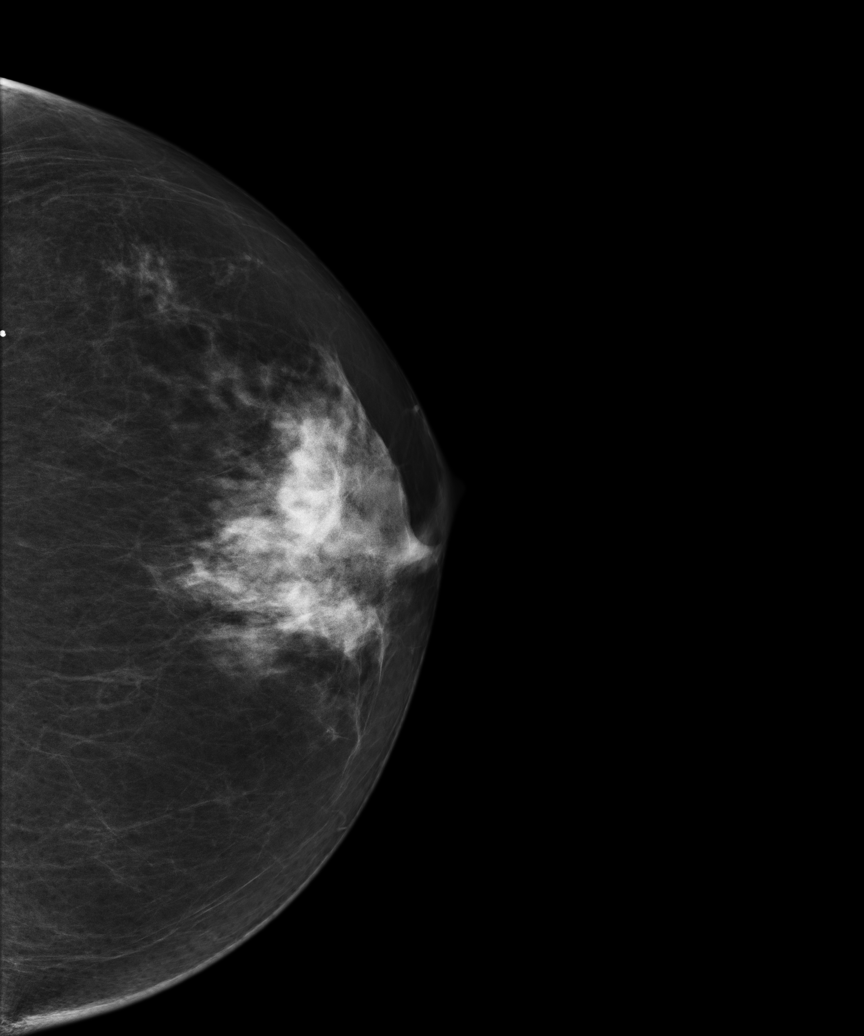
[im 3/5]
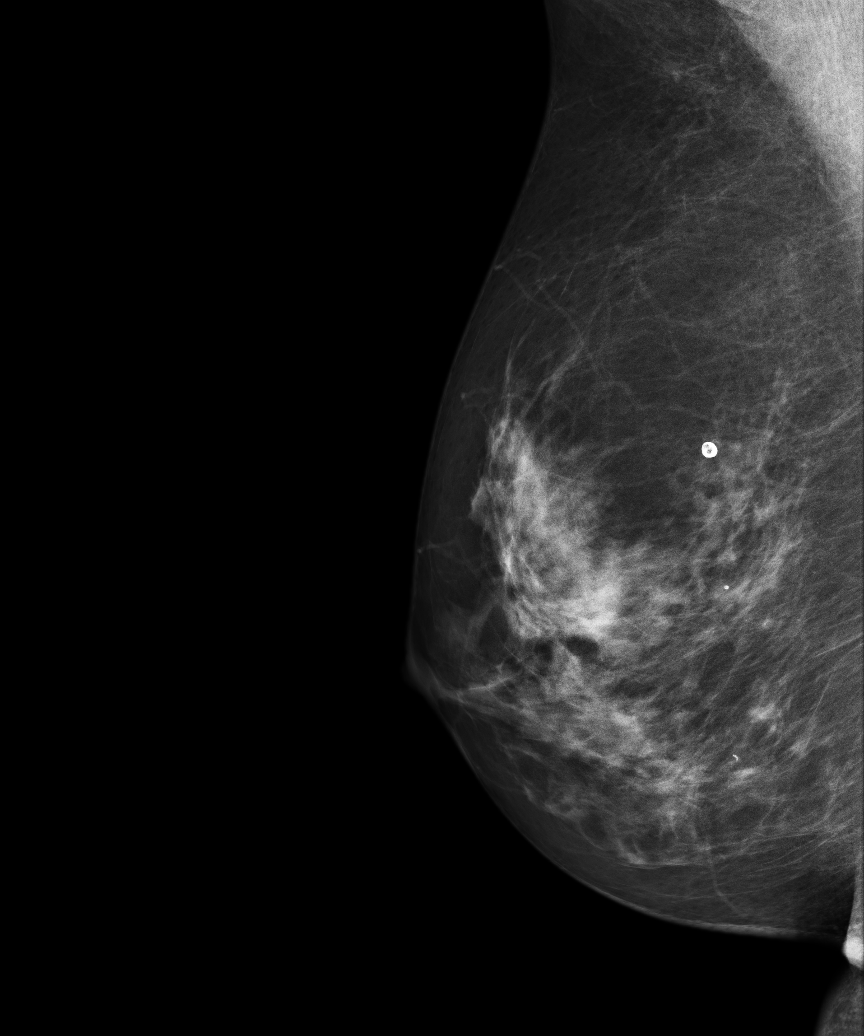
[im 4/5]
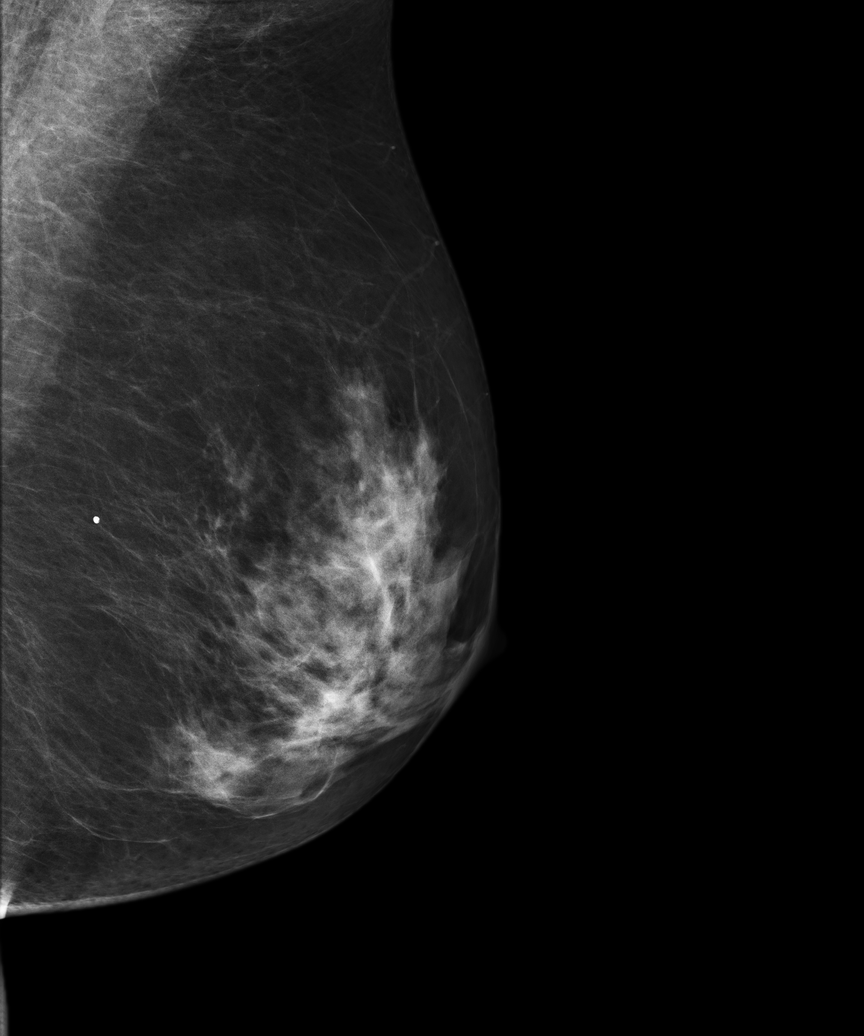
[im 5/5]
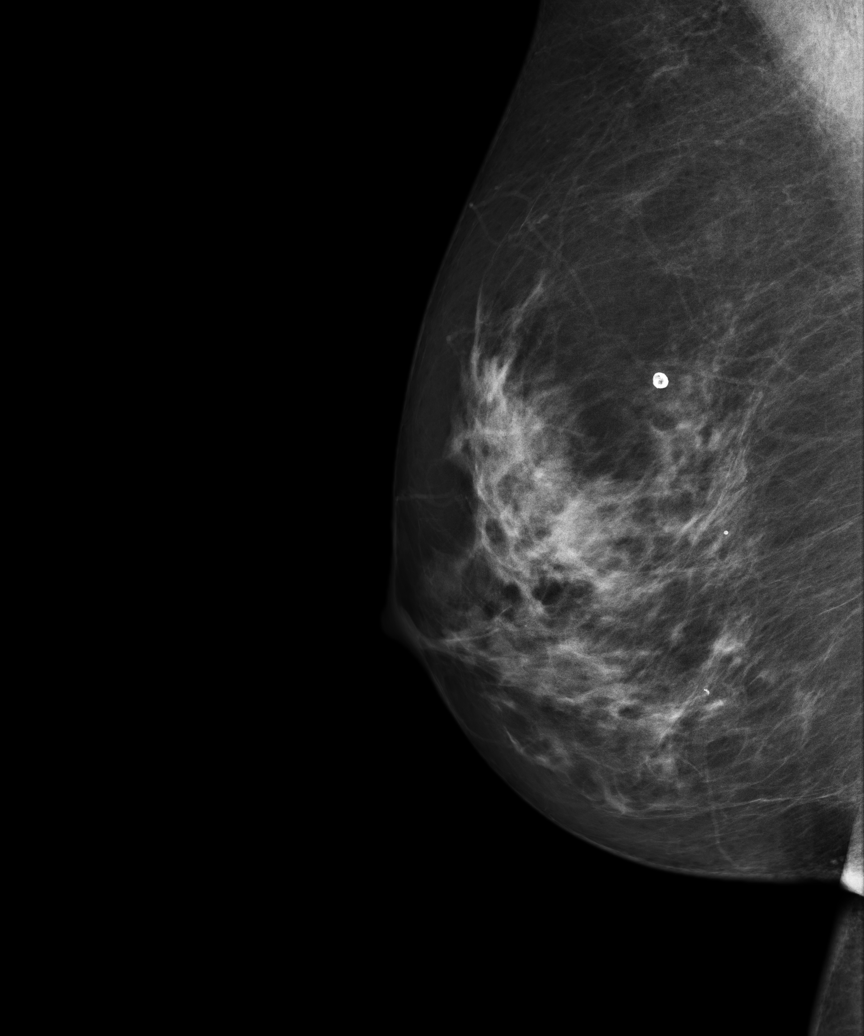

[5 of 5 positions shown; findings below may reference images not displayed]

ACR Breast Density Category c: The breast tissue is heterogeneously
dense, which may obscure small masses
FINDINGS: There are no findings suspicious for malignancy. Images were
processed with CAD.
IMPRESSION: No mammographic evidence of malignancy. A result letter of this
screening mammogram will be mailed directly to the patient.

RECOMMENDATION:
Screening mammogram in one year. (Code:U2-0-761)

BI-RADS CATEGORY  1: Negative.

## 2015-09-08 ENCOUNTER — Telehealth: Payer: BLUE CROSS/BLUE SHIELD | Admitting: Family

## 2015-09-08 DIAGNOSIS — J019 Acute sinusitis, unspecified: Secondary | ICD-10-CM

## 2015-09-08 DIAGNOSIS — B9689 Other specified bacterial agents as the cause of diseases classified elsewhere: Secondary | ICD-10-CM

## 2015-09-08 MED ORDER — DOXYCYCLINE HYCLATE 100 MG PO TABS: 100.0000 mg | ORAL_TABLET | Freq: Two times a day (BID) | ORAL | Status: DC

## 2015-09-08 NOTE — Progress Notes (Signed)

## 2015-09-11 ENCOUNTER — Encounter: Payer: Self-pay | Admitting: Nurse Practitioner

## 2015-09-11 ENCOUNTER — Ambulatory Visit (INDEPENDENT_AMBULATORY_CARE_PROVIDER_SITE_OTHER): Payer: BLUE CROSS/BLUE SHIELD | Admitting: Nurse Practitioner

## 2015-09-11 VITALS — BP 130/78 | HR 90 | Resp 12 | Ht 65.75 in | Wt 144.0 lb

## 2015-09-11 DIAGNOSIS — R7989 Other specified abnormal findings of blood chemistry: Secondary | ICD-10-CM

## 2015-09-11 DIAGNOSIS — Z1211 Encounter for screening for malignant neoplasm of colon: Secondary | ICD-10-CM | POA: Diagnosis not present

## 2015-09-11 DIAGNOSIS — Z0000 Encounter for general adult medical examination without abnormal findings: Secondary | ICD-10-CM | POA: Diagnosis not present

## 2015-09-11 LAB — COMPREHENSIVE METABOLIC PANEL
ALT: 22 U/L (ref 0–35)
AST: 18 U/L (ref 0–37)
Albumin: 4.7 g/dL (ref 3.5–5.2)
Alkaline Phosphatase: 95 U/L (ref 39–117)
BUN: 26 mg/dL — AB (ref 6–23)
CHLORIDE: 106 meq/L (ref 96–112)
CO2: 27 mEq/L (ref 19–32)
Calcium: 9.9 mg/dL (ref 8.4–10.5)
Creatinine, Ser: 0.82 mg/dL (ref 0.40–1.20)
GFR: 74.9 mL/min (ref >60.00)
GLUCOSE: 121 mg/dL — AB (ref 70–99)
POTASSIUM: 4.6 meq/L (ref 3.5–5.1)
SODIUM: 142 meq/L (ref 135–145)
TOTAL PROTEIN: 7.5 g/dL (ref 6.0–8.3)
Total Bilirubin: 0.3 mg/dL (ref 0.2–1.2)

## 2015-09-11 LAB — CBC WITH DIFFERENTIAL/PLATELET
BASOS ABS: 0 10*3/uL (ref 0.0–0.1)
Basophils Relative: 0.4 % (ref 0.0–3.0)
EOS ABS: 0.1 10*3/uL (ref 0.0–0.7)
Eosinophils Relative: 1.2 % (ref 0.0–5.0)
HCT: 37.2 % (ref 36.0–46.0)
Hemoglobin: 12.7 g/dL (ref 12.0–15.0)
LYMPHS ABS: 1.5 10*3/uL (ref 0.7–4.0)
Lymphocytes Relative: 19.3 % (ref 12.0–46.0)
MCHC: 34.3 g/dL (ref 30.0–36.0)
MCV: 80.8 fl (ref 78.0–100.0)
MONO ABS: 0.5 10*3/uL (ref 0.1–1.0)
Monocytes Relative: 6.8 % (ref 3.0–12.0)
NEUTROS PCT: 72.3 % (ref 43.0–77.0)
Neutro Abs: 5.7 10*3/uL (ref 1.4–7.7)
Platelets: 330 10*3/uL (ref 150.0–400.0)
RBC: 4.61 Mil/uL (ref 3.87–5.11)
RDW: 15 % (ref 11.5–15.5)
WBC: 7.9 10*3/uL (ref 4.0–10.5)

## 2015-09-11 LAB — LIPID PANEL
Cholesterol: 252 mg/dL — ABNORMAL HIGH (ref 0–200)
HDL: 55 mg/dL (ref >39.00)
NonHDL: 196.97
Total CHOL/HDL Ratio: 5
Triglycerides: 337 mg/dL — ABNORMAL HIGH (ref 0.0–149.0)
VLDL: 67.4 mg/dL — AB (ref 0.0–40.0)

## 2015-09-11 LAB — HEMOGLOBIN A1C: Hgb A1c MFr Bld: 5.8 % (ref 4.6–6.5)

## 2015-09-11 LAB — TSH: TSH: 0.64 u[IU]/mL (ref 0.35–4.50)

## 2015-09-11 LAB — LDL CHOLESTEROL, DIRECT: LDL DIRECT: 146 mg/dL

## 2015-09-11 MED ORDER — TRAZODONE HCL 50 MG PO TABS: 50.0000 mg | ORAL_TABLET | Freq: Every day | ORAL | Status: AC

## 2015-09-11 MED ORDER — AMLODIPINE BESYLATE 5 MG PO TABS: ORAL_TABLET | ORAL | Status: AC

## 2015-09-11 MED ORDER — LOSARTAN POTASSIUM 50 MG PO TABS: ORAL_TABLET | ORAL | Status: AC

## 2015-09-11 NOTE — Assessment & Plan Note (Signed)
Discussed acute and chronic issues. Reviewed health maintenance measures, PFSHx, and immunizations. Obtain routine labs TSH, Lipid panel, CBC w/ diff, A1c, and CMET.   

## 2015-09-11 NOTE — Patient Instructions (Signed)
Health Maintenance, Female Adopting a healthy lifestyle and getting preventive care can go a long way to promote health and wellness. Talk with your health care provider about what schedule of regular examinations is right for you. This is a good chance for you to check in with your provider about disease prevention and staying healthy. In between checkups, there are plenty of things you can do on your own. Experts have done a lot of research about which lifestyle changes and preventive measures are most likely to keep you healthy. Ask your health care provider for more information. WEIGHT AND DIET  Eat a healthy diet  Be sure to include plenty of vegetables, fruits, low-fat dairy products, and lean protein.  Do not eat a lot of foods high in solid fats, added sugars, or salt.  Get regular exercise. This is one of the most important things you can do for your health.  Most adults should exercise for at least 150 minutes each week. The exercise should increase your heart rate and make you sweat (moderate-intensity exercise).  Most adults should also do strengthening exercises at least twice a week. This is in addition to the moderate-intensity exercise.  Maintain a healthy weight  Body mass index (BMI) is a measurement that can be used to identify possible weight problems. It estimates body fat based on height and weight. Your health care provider can help determine your BMI and help you achieve or maintain a healthy weight.  For females 20 years of age and older:   A BMI below 18.5 is considered underweight.  A BMI of 18.5 to 24.9 is normal.  A BMI of 25 to 29.9 is considered overweight.  A BMI of 30 and above is considered obese.  Watch levels of cholesterol and blood lipids  You should start having your blood tested for lipids and cholesterol at 63 years of age, then have this test every 5 years.  You may need to have your cholesterol levels checked more often if:  Your lipid  or cholesterol levels are high.  You are older than 63 years of age.  You are at high risk for heart disease.  CANCER SCREENING   Lung Cancer  Lung cancer screening is recommended for adults 55-80 years old who are at high risk for lung cancer because of a history of smoking.  A yearly low-dose CT scan of the lungs is recommended for people who:  Currently smoke.  Have quit within the past 15 years.  Have at least a 30-pack-year history of smoking. A pack year is smoking an average of one pack of cigarettes a day for 1 year.  Yearly screening should continue until it has been 15 years since you quit.  Yearly screening should stop if you develop a health problem that would prevent you from having lung cancer treatment.  Breast Cancer  Practice breast self-awareness. This means understanding how your breasts normally appear and feel.  It also means doing regular breast self-exams. Let your health care provider know about any changes, no matter how small.  If you are in your 20s or 30s, you should have a clinical breast exam (CBE) by a health care provider every 1-3 years as part of a regular health exam.  If you are 40 or older, have a CBE every year. Also consider having a breast X-ray (mammogram) every year.  If you have a family history of breast cancer, talk to your health care provider about genetic screening.  If you   are at high risk for breast cancer, talk to your health care provider about having an MRI and a mammogram every year.  Breast cancer gene (BRCA) assessment is recommended for women who have family members with BRCA-related cancers. BRCA-related cancers include:  Breast.  Ovarian.  Tubal.  Peritoneal cancers.  Results of the assessment will determine the need for genetic counseling and BRCA1 and BRCA2 testing. Cervical Cancer Your health care provider may recommend that you be screened regularly for cancer of the pelvic organs (ovaries, uterus, and  vagina). This screening involves a pelvic examination, including checking for microscopic changes to the surface of your cervix (Pap test). You may be encouraged to have this screening done every 3 years, beginning at age 21.  For women ages 30-65, health care providers may recommend pelvic exams and Pap testing every 3 years, or they may recommend the Pap and pelvic exam, combined with testing for human papilloma virus (HPV), every 5 years. Some types of HPV increase your risk of cervical cancer. Testing for HPV may also be done on women of any age with unclear Pap test results.  Other health care providers may not recommend any screening for nonpregnant women who are considered low risk for pelvic cancer and who do not have symptoms. Ask your health care provider if a screening pelvic exam is right for you.  If you have had past treatment for cervical cancer or a condition that could lead to cancer, you need Pap tests and screening for cancer for at least 20 years after your treatment. If Pap tests have been discontinued, your risk factors (such as having a new sexual partner) need to be reassessed to determine if screening should resume. Some women have medical problems that increase the chance of getting cervical cancer. In these cases, your health care provider may recommend more frequent screening and Pap tests. Colorectal Cancer  This type of cancer can be detected and often prevented.  Routine colorectal cancer screening usually begins at 63 years of age and continues through 63 years of age.  Your health care provider may recommend screening at an earlier age if you have risk factors for colon cancer.  Your health care provider may also recommend using home test kits to check for hidden blood in the stool.  A small camera at the end of a tube can be used to examine your colon directly (sigmoidoscopy or colonoscopy). This is done to check for the earliest forms of colorectal  cancer.  Routine screening usually begins at age 50.  Direct examination of the colon should be repeated every 5-10 years through 63 years of age. However, you may need to be screened more often if early forms of precancerous polyps or small growths are found. Skin Cancer  Check your skin from head to toe regularly.  Tell your health care provider about any new moles or changes in moles, especially if there is a change in a mole's shape or color.  Also tell your health care provider if you have a mole that is larger than the size of a pencil eraser.  Always use sunscreen. Apply sunscreen liberally and repeatedly throughout the day.  Protect yourself by wearing long sleeves, pants, a wide-brimmed hat, and sunglasses whenever you are outside. HEART DISEASE, DIABETES, AND HIGH BLOOD PRESSURE   High blood pressure causes heart disease and increases the risk of stroke. High blood pressure is more likely to develop in:  People who have blood pressure in the high end   of the normal range (130-139/85-89 mm Hg).  People who are overweight or obese.  People who are African American.  If you are 38-23 years of age, have your blood pressure checked every 3-5 years. If you are 61 years of age or older, have your blood pressure checked every year. You should have your blood pressure measured twice--once when you are at a hospital or clinic, and once when you are not at a hospital or clinic. Record the average of the two measurements. To check your blood pressure when you are not at a hospital or clinic, you can use:  An automated blood pressure machine at a pharmacy.  A home blood pressure monitor.  If you are between 45 years and 39 years old, ask your health care provider if you should take aspirin to prevent strokes.  Have regular diabetes screenings. This involves taking a blood sample to check your fasting blood sugar level.  If you are at a normal weight and have a low risk for diabetes,  have this test once every three years after 63 years of age.  If you are overweight and have a high risk for diabetes, consider being tested at a younger age or more often. PREVENTING INFECTION  Hepatitis B  If you have a higher risk for hepatitis B, you should be screened for this virus. You are considered at high risk for hepatitis B if:  You were born in a country where hepatitis B is common. Ask your health care provider which countries are considered high risk.  Your parents were born in a high-risk country, and you have not been immunized against hepatitis B (hepatitis B vaccine).  You have HIV or AIDS.  You use needles to inject street drugs.  You live with someone who has hepatitis B.  You have had sex with someone who has hepatitis B.  You get hemodialysis treatment.  You take certain medicines for conditions, including cancer, organ transplantation, and autoimmune conditions. Hepatitis C  Blood testing is recommended for:  Everyone born from [birth year] through 1963.  Anyone with known risk factors for hepatitis C. Sexually transmitted infections (STIs)  You should be screened for sexually transmitted infections (STIs) including gonorrhea and chlamydia if:  You are sexually active and are younger than 63 years of age.  You are older than 63 years of age and your health care provider tells you that you are at risk for this type of infection.  Your sexual activity has changed since you were last screened and you are at an increased risk for chlamydia or gonorrhea. Ask your health care provider if you are at risk.  If you do not have HIV, but are at risk, it may be recommended that you take a prescription medicine daily to prevent HIV infection. This is called pre-exposure prophylaxis (PrEP). You are considered at risk if:  You are sexually active and do not regularly use condoms or know the HIV status of your partner(s).  You take drugs by injection.  You are sexually  active with a partner who has HIV. Talk with your health care provider about whether you are at high risk of being infected with HIV. If you choose to begin PrEP, you should first be tested for HIV. You should then be tested every 3 months for as long as you are taking PrEP.  PREGNANCY   If you are premenopausal and you may become pregnant, ask your health care provider about preconception counseling.  If you may  become pregnant, take 400 to 800 micrograms (mcg) of folic acid every day.  If you want to prevent pregnancy, talk to your health care provider about birth control (contraception). OSTEOPOROSIS AND MENOPAUSE   Osteoporosis is a disease in which the bones lose minerals and strength with aging. This can result in serious bone fractures. Your risk for osteoporosis can be identified using a bone density scan.  If you are 61 years of age or older, or if you are at risk for osteoporosis and fractures, ask your health care provider if you should be screened.  Ask your health care provider whether you should take a calcium or vitamin D supplement to lower your risk for osteoporosis.  Menopause may have certain physical symptoms and risks.  Hormone replacement therapy may reduce some of these symptoms and risks. Talk to your health care provider about whether hormone replacement therapy is right for you.  HOME CARE INSTRUCTIONS   Schedule regular health, dental, and eye exams.  Stay current with your immunizations.   Do not use any tobacco products including cigarettes, chewing tobacco, or electronic cigarettes.  If you are pregnant, do not drink alcohol.  If you are breastfeeding, limit how much and how often you drink alcohol.  Limit alcohol intake to no more than 1 drink per day for nonpregnant women. One drink equals 12 ounces of beer, 5 ounces of wine, or 1 ounces of hard liquor.  Do not use street drugs.  Do not share needles.  Ask your health care provider for help if  you need support or information about quitting drugs.  Tell your health care provider if you often feel depressed.  Tell your health care provider if you have ever been abused or do not feel safe at home.   This information is not intended to replace advice given to you by your health care provider. Make sure you discuss any questions you have with your health care provider.   Document Released: 11/01/2010 Document Revised: 05/09/2014 Document Reviewed: 03/20/2013 Elsevier Interactive Patient Education [employer].

## 2015-09-11 NOTE — Progress Notes (Signed)
Patient ID: Colleen Hogan, female    DOB: [birth year]  Age: 65 y.o. MRN: [id number]  CC: Annual Exam   HPI Colleen Hogan presents for Annual exam with Breast exam.   1) Annual Physical   Diet- Eating at home 99% of the time   Exercise- Elliptical 3 x a week 30 min., video 1 x every other week.   Immunizations-    Tdap- Declined   Mammogram- Due in July   Colonoscopy- IFOBT interested   Eye Exam- Wears contacts, last exam 2011 (gas permeable contacts)   Dental Exam- Not UTD  LMP- post-menopausal   Needs- OB/GYN  Labs- Non-fasting   Fall- Neg.   Depression- Neg.  Refills: BP meds and trazodone    History Colleen Hogan has a past medical history of Arthritis; GERD (gastroesophageal reflux disease); Hypertension; and Fibromyalgia.   Colleen Hogan has no past surgical history on file.   Her family history includes Alcohol abuse in her father; Arthritis in her father; Cancer in her mother; Hypertension in her mother; Kidney disease in her mother.Colleen Hogan reports that Colleen Hogan has never smoked. Colleen Hogan does not have any smokeless tobacco history on file. Colleen Hogan reports that Colleen Hogan drinks alcohol. Colleen Hogan reports that Colleen Hogan does not use illicit drugs.  Outpatient Prescriptions Prior to Visit  Medication Sig Dispense Refill  . doxycycline (VIBRA-TABS) 100 MG tablet Take 1 tablet (100 mg total) by mouth 2 (two) times daily. 20 tablet 0  . amLODipine (NORVASC) 5 MG tablet TAKE 1 TABLET(5 MG) BY MOUTH DAILY 30 tablet 0  . losartan (COZAAR) 50 MG tablet TAKE 1 TABLET(50 MG) BY MOUTH DAILY 30 tablet 0  . traZODone (DESYREL) 50 MG tablet Take 1 tablet (50 mg total) by mouth at bedtime. 30 tablet 11  . calcium-vitamin D (OSCAL WITH D) 250-125 MG-UNIT per tablet Take 1 tablet by mouth daily. Reported on 09/11/2015    . Multiple Vitamins-Minerals (MULTIVITAMIN WITH MINERALS) tablet Take 1 tablet by mouth daily. Reported on 09/11/2015    . EPINEPHrine 0.3 mg/0.3 mL IJ SOAJ injection Inject 0.3 mLs (0.3 mg total) into the muscle once. (Patient not  taking: Reported on 09/11/2015) 1 Device 3  . predniSONE (DELTASONE) 20 MG tablet Take 3 tablets (60 mg total) by mouth daily. (Patient not taking: Reported on 09/11/2015) 15 tablet 0   No facility-administered medications prior to visit.    ROS Review of Systems  Constitutional: Negative for fever, chills, diaphoresis, fatigue and unexpected weight change.  HENT: Negative for tinnitus and trouble swallowing.   Eyes: Negative for visual disturbance.  Respiratory: Negative for chest tightness, shortness of breath and wheezing.   Cardiovascular: Negative for chest pain, palpitations and leg swelling.  Gastrointestinal: Negative for nausea, vomiting, abdominal pain, diarrhea, constipation and blood in stool.  Endocrine: Negative for polydipsia, polyphagia and polyuria.  Genitourinary: Negative for dysuria, hematuria, vaginal discharge and vaginal pain.  Musculoskeletal: Negative for myalgias, back pain, arthralgias and gait problem.  Skin: Negative for color change and rash.  Neurological: Negative for dizziness, weakness, numbness and headaches.  Hematological: Does not bruise/bleed easily.  Psychiatric/Behavioral: Negative for suicidal ideas and sleep disturbance. The patient is not nervous/anxious.     Objective:  BP 130/78 mmHg  Pulse 90  Resp 12  Ht 5' 5.75" (1.67 m)  Wt 144 lb (65.318 kg)  BMI 23.42 kg/m2  SpO2 98%  Physical Exam  Constitutional: Colleen Hogan is oriented to person, place, and time. Colleen Hogan appears well-developed and well-nourished. No distress.  HENT:  Head: Normocephalic and atraumatic.  Right Ear: External ear normal.  Left Ear: External ear normal.  Nose: Nose normal.  Mouth/Throat: Oropharynx is clear and moist. No oropharyngeal exudate.  TMs and canals clear bilaterally  Eyes: Conjunctivae and EOM are normal. Pupils are equal, round, and reactive to light. Right eye exhibits no discharge. Left eye exhibits no discharge. No scleral icterus.  Neck: Normal range of  motion. Neck supple. No thyromegaly present.  Cardiovascular: Normal rate, regular rhythm, normal heart sounds and intact distal pulses.  Exam reveals no gallop and no friction rub.   No murmur heard. Pulmonary/Chest: Effort normal and breath sounds normal. No respiratory distress. Colleen Hogan has no wheezes. Colleen Hogan has no rales. Colleen Hogan exhibits no tenderness.  Breast exam without concerns  Abdominal: Soft. Bowel sounds are normal. Colleen Hogan exhibits no distension and no mass. There is no tenderness. There is no rebound and no guarding.  Genitourinary:  Deferred to GYN  Musculoskeletal: Normal range of motion. Colleen Hogan exhibits no edema or tenderness.  Lymphadenopathy:    Colleen Hogan has no cervical adenopathy.  Neurological: Colleen Hogan is alert and oriented to person, place, and time. Colleen Hogan has normal reflexes. No cranial nerve deficit. Colleen Hogan exhibits normal muscle tone. Coordination normal.  Skin: Skin is warm and dry. No rash noted. Colleen Hogan is not diaphoretic. No erythema. No pallor.  Psychiatric: Colleen Hogan has a normal mood and affect. Her behavior is normal. Judgment and thought content normal.   Assessment & Plan:   [name] was seen today for annual exam.  Diagnoses and all orders for this visit:  Routine general medical examination at a health care facility -     Comprehensive metabolic panel -     Hemoglobin A1c -     Lipid panel -     TSH -     CBC with Differential/Platelet -     Fecal occult blood, imunochemical  Screen for colon cancer -     Fecal occult blood, imunochemical  Other orders -     amLODipine (NORVASC) 5 MG tablet; TAKE 1 TABLET(5 MG) BY MOUTH DAILY -     losartan (COZAAR) 50 MG tablet; TAKE 1 TABLET(50 MG) BY MOUTH DAILY -     traZODone (DESYREL) 50 MG tablet; Take 1 tablet (50 mg total) by mouth at bedtime.   I have discontinued Ms. Schellenberg's predniSONE and EPINEPHrine. I am also having her maintain her multivitamin with minerals, calcium-vitamin D, doxycycline, amLODipine, losartan, and traZODone.  Meds  ordered this encounter  Medications  . amLODipine (NORVASC) 5 MG tablet    Sig: TAKE 1 TABLET(5 MG) BY MOUTH DAILY    Dispense:  90 tablet    Refill:  3    Order Specific Question:  Supervising Provider    Answer:  Duncan Dull L [[id number]]  . losartan (COZAAR) 50 MG tablet    Sig: TAKE 1 TABLET(50 MG) BY MOUTH DAILY    Dispense:  90 tablet    Refill:  3    Order Specific Question:  Supervising Provider    Answer:  Duncan Dull L [[id number]]  . traZODone (DESYREL) 50 MG tablet    Sig: Take 1 tablet (50 mg total) by mouth at bedtime.    Dispense:  90 tablet    Refill:  3    Order Specific Question:  Supervising Provider    Answer:  Sherlene Shams [[id number]]     Follow-up: Return if symptoms worsen or fail to improve.

## 2015-09-11 NOTE — Addendum Note (Signed)
Addended by: Carollee LeitzSS, Konnor Vondrasek M on: 09/11/2015 03:16 PM   Modules accepted: Orders, SmartSet

## 2015-09-11 NOTE — Assessment & Plan Note (Signed)
IFOB cards requested

## 2015-10-20 ENCOUNTER — Other Ambulatory Visit (INDEPENDENT_AMBULATORY_CARE_PROVIDER_SITE_OTHER): Payer: BLUE CROSS/BLUE SHIELD

## 2015-10-20 DIAGNOSIS — Z1211 Encounter for screening for malignant neoplasm of colon: Secondary | ICD-10-CM

## 2015-10-20 LAB — FECAL OCCULT BLOOD, IMMUNOCHEMICAL: FECAL OCCULT BLD: NEGATIVE

## 2016-01-06 ENCOUNTER — Telehealth: Payer: Self-pay | Admitting: *Deleted

## 2016-01-06 DIAGNOSIS — Z1231 Encounter for screening mammogram for malignant neoplasm of breast: Secondary | ICD-10-CM

## 2016-01-06 NOTE — Telephone Encounter (Signed)
Mammogram order as requested.

## 2016-01-06 NOTE — Telephone Encounter (Signed)
Patient has requested orders for her mammogram. [facility] contact [phone]

## 2016-01-11 ENCOUNTER — Other Ambulatory Visit: Payer: Self-pay | Admitting: Family Medicine

## 2016-01-11 DIAGNOSIS — Z1231 Encounter for screening mammogram for malignant neoplasm of breast: Secondary | ICD-10-CM

## 2016-02-03 ENCOUNTER — Ambulatory Visit
Admission: RE | Admit: 2016-02-03 | Discharge: 2016-02-03 | Disposition: A | Discharge status: Home / Self Care | Payer: BLUE CROSS/BLUE SHIELD | Source: Ambulatory Visit | Attending: Family Medicine | Admitting: Family Medicine

## 2016-02-03 DIAGNOSIS — Z1231 Encounter for screening mammogram for malignant neoplasm of breast: Secondary | ICD-10-CM | POA: Diagnosis not present

## 2016-05-25 ENCOUNTER — Telehealth: Payer: BLUE CROSS/BLUE SHIELD | Admitting: Family

## 2016-05-25 DIAGNOSIS — B9789 Other viral agents as the cause of diseases classified elsewhere: Secondary | ICD-10-CM

## 2016-05-25 DIAGNOSIS — J329 Chronic sinusitis, unspecified: Secondary | ICD-10-CM

## 2016-05-25 MED ORDER — FLUTICASONE PROPIONATE 50 MCG/ACT NA SUSP: 2.0000 | Freq: Every day | NASAL | 6 refills | Status: AC

## 2016-05-25 NOTE — Progress Notes (Signed)

## 2017-11-27 ENCOUNTER — Other Ambulatory Visit: Payer: Self-pay | Admitting: Internal Medicine

## 2017-11-27 DIAGNOSIS — Z1231 Encounter for screening mammogram for malignant neoplasm of breast: Secondary | ICD-10-CM

## 2019-01-24 ENCOUNTER — Other Ambulatory Visit: Payer: Self-pay | Admitting: Internal Medicine

## 2019-01-24 DIAGNOSIS — Z1231 Encounter for screening mammogram for malignant neoplasm of breast: Secondary | ICD-10-CM

## 2019-03-19 ENCOUNTER — Ambulatory Visit
Admission: RE | Admit: 2019-03-19 | Discharge: 2019-03-19 | Disposition: A | Discharge status: Home / Self Care | Payer: Medicare Other | Source: Ambulatory Visit | Attending: Internal Medicine | Admitting: Internal Medicine

## 2019-03-19 DIAGNOSIS — Z1231 Encounter for screening mammogram for malignant neoplasm of breast: Secondary | ICD-10-CM | POA: Insufficient documentation

## 2020-01-09 ENCOUNTER — Ambulatory Visit
Admission: RE | Admit: 2020-01-09 | Discharge: 2020-01-09 | Disposition: A | Discharge status: Home / Self Care | Payer: Medicare Other | Source: Ambulatory Visit | Attending: Emergency Medicine | Admitting: Emergency Medicine

## 2020-01-09 ENCOUNTER — Other Ambulatory Visit: Payer: Self-pay

## 2020-01-09 VITALS — BP 134/82 | HR 93 | Temp 98.3°F | Resp 18

## 2020-01-09 DIAGNOSIS — R300 Dysuria: Secondary | ICD-10-CM | POA: Insufficient documentation

## 2020-01-09 LAB — POCT URINALYSIS DIP (MANUAL ENTRY)
Bilirubin, UA: NEGATIVE
Glucose, UA: NEGATIVE mg/dL
Ketones, POC UA: NEGATIVE mg/dL
Leukocytes, UA: NEGATIVE
Nitrite, UA: NEGATIVE
Protein Ur, POC: 30 mg/dL — AB
Spec Grav, UA: 1.02 (ref 1.010–1.025)
Urobilinogen, UA: 1 E.U./dL
pH, UA: 6 (ref 5.0–8.0)

## 2020-01-09 MED ORDER — SULFAMETHOXAZOLE-TRIMETHOPRIM 800-160 MG PO TABS: 1.0000 | ORAL_TABLET | Freq: Two times a day (BID) | ORAL | 0 refills | Status: AC

## 2020-01-09 NOTE — Discharge Instructions (Signed)
Bactrim twice daily for 3 days for UTI Push fluids May continue azo as needed Urine culture should return in 2 days  Follow up if not improving or worsening

## 2020-01-09 NOTE — ED Triage Notes (Signed)
Pt presents with c/o dysuria for past week  

## 2020-01-09 NOTE — ED Provider Notes (Signed)
[facility]    CSN: [id number] Arrival date & time: 01/09/20  1144      History   Chief Complaint Chief Complaint  Patient presents with  . Dysuria    HPI Colleen Hogan is a 69 y.o. female history of GERD, hypertension, presenting today for evaluation of dysuria.  Patient reports she has had dysuria, urinary frequency urgency as well as swelling around her urethra.  She has felt her urine has had odor.  Feels similar to prior UTIs, but does not report recurrent UTIs.  Denies any itching, vaginal discharge.  Denies history of yeast or BV.  Denies fevers nausea or vomiting.  Denies abdominal pain or back pain.  Has been using over-the-counter Azo with relief.  HPI  Past Medical History:  Diagnosis Date  . Arthritis   . Fibromyalgia   . GERD (gastroesophageal reflux disease)   . Hypertension     Patient Active Problem List   Diagnosis Date Noted  . Sleep disturbance 09/14/2014  . Routine general medical examination at a health care facility 09/03/2013  . Screening for breast cancer 09/03/2013  . Screen for colon cancer 09/03/2013  . Screening for osteoporosis 09/03/2013  . Need for shingles vaccine 09/03/2013  . HTN (hypertension) 08/15/2013  . GERD (gastroesophageal reflux disease) 08/15/2013  . Discoloration of skin 08/15/2013    No past surgical history on file.  OB History   No obstetric history on file.      Home Medications    Prior to Admission medications   Medication Sig Start Date End Date Taking? Authorizing Provider  amLODipine (NORVASC) 5 MG tablet TAKE 1 TABLET(5 MG) BY MOUTH DAILY 09/11/15   Doss, Oleh Genin, RN  calcium-vitamin D (OSCAL WITH D) 250-125 MG-UNIT per tablet Take 1 tablet by mouth daily. Reported on 09/11/2015    [provider]  fluticasone (FLONASE) 50 MCG/ACT nasal spray Place 2 sprays into both nostrils daily. 05/25/16   Junie Spencer, FNP  losartan (COZAAR) 50 MG tablet TAKE 1 TABLET(50 MG) BY MOUTH DAILY 09/11/15    Carollee Leitz, RN  Multiple Vitamins-Minerals (MULTIVITAMIN WITH MINERALS) tablet Take 1 tablet by mouth daily. Reported on 09/11/2015    [provider]  sulfamethoxazole-trimethoprim (BACTRIM DS) 800-160 MG tablet Take 1 tablet by mouth 2 (two) times daily for 3 days. 01/09/20 01/12/20  Jala Dundon C, PA-C  traZODone (DESYREL) 50 MG tablet Take 1 tablet (50 mg total) by mouth at bedtime. 09/11/15   Carollee Leitz, RN    Family History Family History  Problem Relation Age of Onset  . Cancer Mother        Breast cancer  . Hypertension Mother   . Kidney disease Mother        Dialysis x 20 years  . Breast cancer Mother 72  . Alcohol abuse Father   . Arthritis Father     Social History Social History   Tobacco Use  . Smoking status: Never Smoker  Substance Use Topics  . Alcohol use: Yes    Alcohol/week: 0.0 standard drinks    Comment: Rarely  . Drug use: No     Allergies   Proton pump inhibitors, Beta adrenergic blockers, Penicillins, and Verapamil hcl er   Review of Systems Review of Systems  Constitutional: Negative for fever.  Respiratory: Negative for shortness of breath.   Cardiovascular: Negative for chest pain.  Gastrointestinal: Negative for abdominal pain, diarrhea, nausea and vomiting.  Genitourinary: Positive for dysuria, frequency and  urgency. Negative for flank pain, genital sores, hematuria, menstrual problem, vaginal bleeding, vaginal discharge and vaginal pain.  Musculoskeletal: Negative for back pain.  Skin: Negative for rash.  Neurological: Negative for dizziness, light-headedness and headaches.     Physical Exam Triage Vital Signs ED Triage Vitals  Enc Vitals Group     BP 01/09/20 1214 134/82     Pulse Rate 01/09/20 1214 93     Resp 01/09/20 1214 18     Temp 01/09/20 1214 98.3 F (36.8 C)     Temp src --      SpO2 01/09/20 1214 97 %     Weight --      Height --      Head Circumference --      Peak Flow --      Pain Score  01/09/20 1212 5     Pain Loc --      Pain Edu? --      Excl. in GC? --    No data found.  Updated Vital Signs BP 134/82   Pulse 93   Temp 98.3 F (36.8 C)   Resp 18   SpO2 97%   Visual Acuity Right Eye Distance:   Left Eye Distance:   Bilateral Distance:    Right Eye Near:   Left Eye Near:    Bilateral Near:     Physical Exam Vitals and nursing note reviewed.  Constitutional:      Appearance: She is well-developed.     Comments: No acute distress  HENT:     Head: Normocephalic and atraumatic.     Nose: Nose normal.  Eyes:     Conjunctiva/sclera: Conjunctivae normal.  Cardiovascular:     Rate and Rhythm: Normal rate.  Pulmonary:     Effort: Pulmonary effort is normal. No respiratory distress.  Abdominal:     General: There is no distension.  Musculoskeletal:        General: Normal range of motion.     Cervical back: Neck supple.  Skin:    General: Skin is warm and dry.  Neurological:     Mental Status: She is alert and oriented to person, place, and time.      UC Treatments / Results  Labs (all labs ordered are listed, but only abnormal results are displayed) Labs Reviewed  POCT URINALYSIS DIP (MANUAL ENTRY) - Abnormal; Notable for the following components:      Result Value   Blood, UA small (*)    Protein Ur, POC =30 (*)    All other components within normal limits  URINE CULTURE    EKG   Radiology No results found.  Procedures Procedures (including critical care time)  Medications Ordered in UC Medications - No data to display  Initial Impression / Assessment and Plan / UC Course  I have reviewed the triage vital signs and the nursing notes.  Pertinent labs & imaging results that were available during my care of the patient were reviewed by me and considered in my medical decision making (see chart for details).     UA with negative leuks and nitrites, does have small hemoglobin, urine culture pending.  Treating clinically for UTI  this Dr. Education officer, environmental with patient.  Patient wished to defer any further testing for any vaginal causes of symptoms.  Treating with Bactrim twice daily x3 days, push fluids, may continue to use Azo as needed.  Discussed strict return precautions. Patient verbalized understanding and is agreeable with plan.  Final Clinical  Impressions(s) / UC Diagnoses   Final diagnoses:  Dysuria     Discharge Instructions     Bactrim twice daily for 3 days for UTI Push fluids May continue azo as needed Urine culture should return in 2 days  Follow up if not improving or worsening    ED Prescriptions    Medication Sig Dispense Auth. Provider   sulfamethoxazole-trimethoprim (BACTRIM DS) 800-160 MG tablet Take 1 tablet by mouth 2 (two) times daily for 3 days. 6 tablet Tamsen Reist, [place] C, PA-C     PDMP not reviewed this encounter.   Lew Dawes, PA-C 01/09/20 1246

## 2020-01-10 LAB — URINE CULTURE: Culture: <10000 — AB

## 2020-03-23 NOTE — Progress Notes (Signed)
03/24/2020 10:39 AM   Colleen Hogan 1951/01/26 [id number]  Referring provider: Barbette Reichmann, MD [address] [facility] [place],  [place] [address]  Chief Complaint  Patient presents with  . Hematuria    HPI: 70 year old female who presents today for further evaluation of microscopic hematuria.  01/2020, she was found to have evidence of microscopic hematuria but otherwise her urine is bland.  She was treated with Bactrim for 3 days based on clinical suspicion for infection.  Urine culture was negative.  She was seen and evaluated on 01/17/2019 which time she had 4-10 red blood cells per high-powered field.  Urine was otherwise unremarkable.  Her urinalysis was repeated on 02/28/2020 which showed 4-10 white blood cells as well as red blood cells but otherwise unremarkable in the absence of infection.  Associated urine cultures were negative.  She was given Cipro on this occasion.  She reports after taking this for several days, her symptoms have completely resolved.  When all of this started, her symptoms primarily included pain at her urethra, vaginal symptoms including dryness and discomfort as well as a loose feeling of her labia.  She also has some lower pelvic pressure but no urgency or frequency.  No fevers or chills.  She is asymptomatic today.   No recent cross-sectional imaging.  Never smoker.  No history of kidney stones.  UA today is negative.  PMH: Past Medical History:  Diagnosis Date  . Arthritis   . Fibromyalgia   . GERD (gastroesophageal reflux disease)   . Hypertension     Surgical History: No past surgical history on file.  Home Medications:  Allergies as of 03/24/2020      Reactions   Proton Pump Inhibitors Rash   Full body rash with Nexium    Beta Adrenergic Blockers    Depression   Penicillins Swelling   Verapamil Hcl Er    Strawberry Extract Rash      Medication List       Accurate as of March 24, 2020 10:39 AM. If  you have any questions, ask your nurse or doctor.        amLODipine 5 MG tablet Commonly known as: NORVASC TAKE 1 TABLET(5 MG) BY MOUTH DAILY   calcium-vitamin D 250-125 MG-UNIT tablet Commonly known as: OSCAL WITH D Take 1 tablet by mouth daily. Reported on 09/11/2015   ciprofloxacin 500 MG tablet Commonly known as: CIPRO   fluticasone 50 MCG/ACT nasal spray Commonly known as: FLONASE Place 2 sprays into both nostrils daily.   losartan 50 MG tablet Commonly known as: COZAAR TAKE 1 TABLET(50 MG) BY MOUTH DAILY   multivitamin with minerals tablet Take 1 tablet by mouth daily. Reported on 09/11/2015   traZODone 50 MG tablet Commonly known as: DESYREL Take 1 tablet (50 mg total) by mouth at bedtime.       Allergies:  Allergies  Allergen Reactions  . Proton Pump Inhibitors Rash    Full body rash with Nexium   . Beta Adrenergic Blockers     Depression  . Penicillins Swelling  . Verapamil Hcl Er   . Strawberry Extract Rash    Family History: Family History  Problem Relation Age of Onset  . Cancer Mother        Breast cancer  . Hypertension Mother   . Kidney disease Mother        Dialysis x 20 years  . Breast cancer Mother 64  . Alcohol abuse Father   . Arthritis  Father     Social History:  reports that she has never smoked. She has never used smokeless tobacco. She reports current alcohol use. She reports that she does not use drugs.   Physical Exam: BP (!) 165/77   Pulse (!) 109   Ht (!) 9' 9.5" (2.985 m)   Wt 133 lb (60.3 kg)   BMI 6.77 kg/m   Constitutional:  Alert and oriented, No acute distress. HEENT: [place] AT, moist mucus membranes.  Trachea midline, no masses. Cardiovascular: No clubbing, cyanosis, or edema. Respiratory: Normal respiratory effort, no increased work of breathing. Skin: No rashes, bruises or suspicious lesions. Neurologic: Grossly intact, no focal deficits, moving all 4 extremities. Psychiatric: Normal mood and affect.   Lab  Results  Component Value Date   HGBA1C 5.8 09/11/2015    Urinalysis Unremarkable  Assessment & Plan:    1. Microscopic hematuria Multiple occasions of microscopic hematuria in the absence of stone or documented infection.  Multiple urine cultures without bacteria.  Based on her age, she does fall to the high risk category  We discussed the differential diagnosis for microscopic hematuria including nephrolithiasis, renal or upper tract tumors, bladder stones, UTIs, or bladder tumors as well as undetermined etiologies. Per AUA guidelines, I did recommend complete microscopic hematuria evaluation including CTU, possible urine cytology, and office cystoscopy  - Urinalysis, Complete   Vanna Scotland, MD  [facility] [address] [place], [place] [address] [phone]

## 2020-03-24 ENCOUNTER — Other Ambulatory Visit: Payer: Self-pay

## 2020-03-24 ENCOUNTER — Encounter: Payer: Self-pay | Admitting: Urology

## 2020-03-24 ENCOUNTER — Ambulatory Visit (INDEPENDENT_AMBULATORY_CARE_PROVIDER_SITE_OTHER): Payer: Medicare Other | Admitting: Urology

## 2020-03-24 VITALS — BP 165/77 | HR 109 | Ht >= 80 in | Wt 133.0 lb

## 2020-03-24 DIAGNOSIS — R319 Hematuria, unspecified: Secondary | ICD-10-CM

## 2020-03-24 DIAGNOSIS — R3129 Other microscopic hematuria: Secondary | ICD-10-CM

## 2020-03-24 NOTE — Patient Instructions (Signed)
Cystoscopy Cystoscopy is a procedure that is used to help diagnose and sometimes treat conditions that affect the lower urinary tract. The lower urinary tract includes the bladder and the urethra. The urethra is the tube that drains urine from the bladder. Cystoscopy is done using a thin, tube-shaped instrument with a light and camera at the end (cystoscope). The cystoscope may be hard or flexible, depending on the goal of the procedure. The cystoscope is inserted through the urethra, into the bladder. Cystoscopy may be recommended if you have:  Urinary tract infections that keep coming back.  Blood in the urine (hematuria).  An inability to control when you urinate (urinary incontinence) or an overactive bladder.  Unusual cells found in a urine sample.  A blockage in the urethra, such as a urinary stone.  Painful urination.  An abnormality in the bladder found during an intravenous pyelogram (IVP) or CT scan. Cystoscopy may also be done to remove a sample of tissue to be examined under a microscope (biopsy). What are the risks? Generally, this is a safe procedure. However, problems may occur, including:  Infection.  Bleeding.  What happens during the procedure?  1. You will be given one or more of the following: ? A medicine to numb the area (local anesthetic). 2. The area around the opening of your urethra will be cleaned. 3. The cystoscope will be passed through your urethra into your bladder. 4. Germ-free (sterile) fluid will flow through the cystoscope to fill your bladder. The fluid will stretch your bladder so that your health care provider can clearly examine your bladder walls. 5. Your doctor will look at the urethra and bladder. 6. The cystoscope will be removed The procedure may vary among health care providers  What can I expect after the procedure? After the procedure, it is common to have: 1. Some soreness or pain in your abdomen and urethra. 2. Urinary symptoms.  These include: ? Mild pain or burning when you urinate. Pain should stop within a few minutes after you urinate. This may last for up to 1 week. ? A small amount of blood in your urine for several days. ? Feeling like you need to urinate but producing only a small amount of urine. Follow these instructions at home: General instructions  Return to your normal activities as told by your health care provider.   Do not drive for 24 hours if you were given a sedative during your procedure.  Watch for any blood in your urine. If the amount of blood in your urine increases, call your health care provider.  If a tissue sample was removed for testing (biopsy) during your procedure, it is up to you to get your test results. Ask your health care provider, or the department that is doing the test, when your results will be ready.  Drink enough fluid to keep your urine pale yellow.  Keep all follow-up visits as told by your health care provider. This is important. Contact a health care provider if you:  Have pain that gets worse or does not get better with medicine, especially pain when you urinate.  Have trouble urinating.  Have more blood in your urine. Get help right away if you:  Have blood clots in your urine.  Have abdominal pain.  Have a fever or chills.  Are unable to urinate. Summary  Cystoscopy is a procedure that is used to help diagnose and sometimes treat conditions that affect the lower urinary tract.  Cystoscopy is done using   a thin, tube-shaped instrument with a light and camera at the end.  After the procedure, it is common to have some soreness or pain in your abdomen and urethra.  Watch for any blood in your urine. If the amount of blood in your urine increases, call your health care provider.  If you were prescribed an antibiotic medicine, take it as told by your health care provider. Do not stop taking the antibiotic even if you start to feel better. This  information is not intended to replace advice given to you by your health care provider. Make sure you discuss any questions you have with your health care provider. Document Revised: 04/10/2018 Document Reviewed: 04/10/2018 Elsevier Patient Education  2020 [employer]   

## 2020-03-30 LAB — URINALYSIS, COMPLETE
Bilirubin, UA: NEGATIVE
Glucose, UA: NEGATIVE
Ketones, UA: NEGATIVE
Leukocytes,UA: NEGATIVE
Nitrite, UA: NEGATIVE
Protein,UA: NEGATIVE
Specific Gravity, UA: 1.02 (ref 1.005–1.030)
Urobilinogen, Ur: 0.2 mg/dL (ref 0.2–1.0)
pH, UA: 5 (ref 5.0–7.5)

## 2020-03-30 LAB — MICROSCOPIC EXAMINATION: Bacteria, UA: NONE SEEN

## 2020-04-14 ENCOUNTER — Telehealth: Payer: Self-pay | Admitting: Urology

## 2020-04-14 NOTE — Telephone Encounter (Signed)
pt declined to move forward with cysto and ct scan She did not want to reschedule   [facility]

## 2020-05-06 ENCOUNTER — Other Ambulatory Visit: Payer: Self-pay | Admitting: Urology

## 2020-05-13 ENCOUNTER — Other Ambulatory Visit: Payer: Self-pay | Admitting: Internal Medicine

## 2020-05-13 DIAGNOSIS — Z1231 Encounter for screening mammogram for malignant neoplasm of breast: Secondary | ICD-10-CM

## 2021-01-26 ENCOUNTER — Ambulatory Visit
Admission: RE | Admit: 2021-01-26 | Discharge: 2021-01-26 | Disposition: A | Discharge status: Home / Self Care | Payer: Medicare Other | Source: Ambulatory Visit | Attending: Internal Medicine | Admitting: Internal Medicine

## 2021-01-26 ENCOUNTER — Other Ambulatory Visit: Payer: Self-pay

## 2021-01-26 DIAGNOSIS — Z1231 Encounter for screening mammogram for malignant neoplasm of breast: Secondary | ICD-10-CM | POA: Insufficient documentation

## 2022-11-26 IMAGING — MG MM DIGITAL SCREENING BILAT W/ TOMO AND CAD
6 of 10 series · 6 of 30 positions shown · non-contrast
Comparison: Previous exam(s).

CLINICAL DATA: Screening.

EXAM:
DIGITAL SCREENING BILATERAL MAMMOGRAM WITH TOMOSYNTHESIS AND CAD
TECHNIQUE: Bilateral screening digital craniocaudal and mediolateral oblique
mammograms were obtained. Bilateral screening digital breast
tomosynthesis was performed. The images were evaluated with
computer-aided detection.

[R MLO synth-2D (1 of 2)]
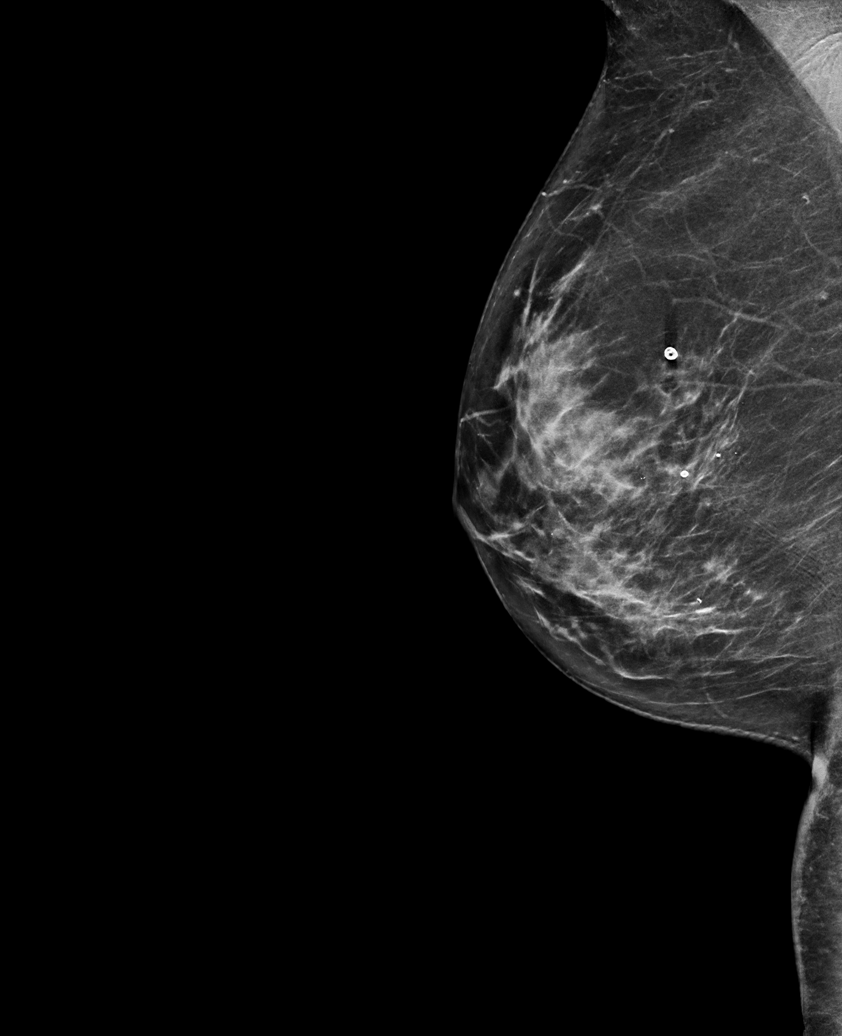

[R MLO synth-2D (2 of 2)]
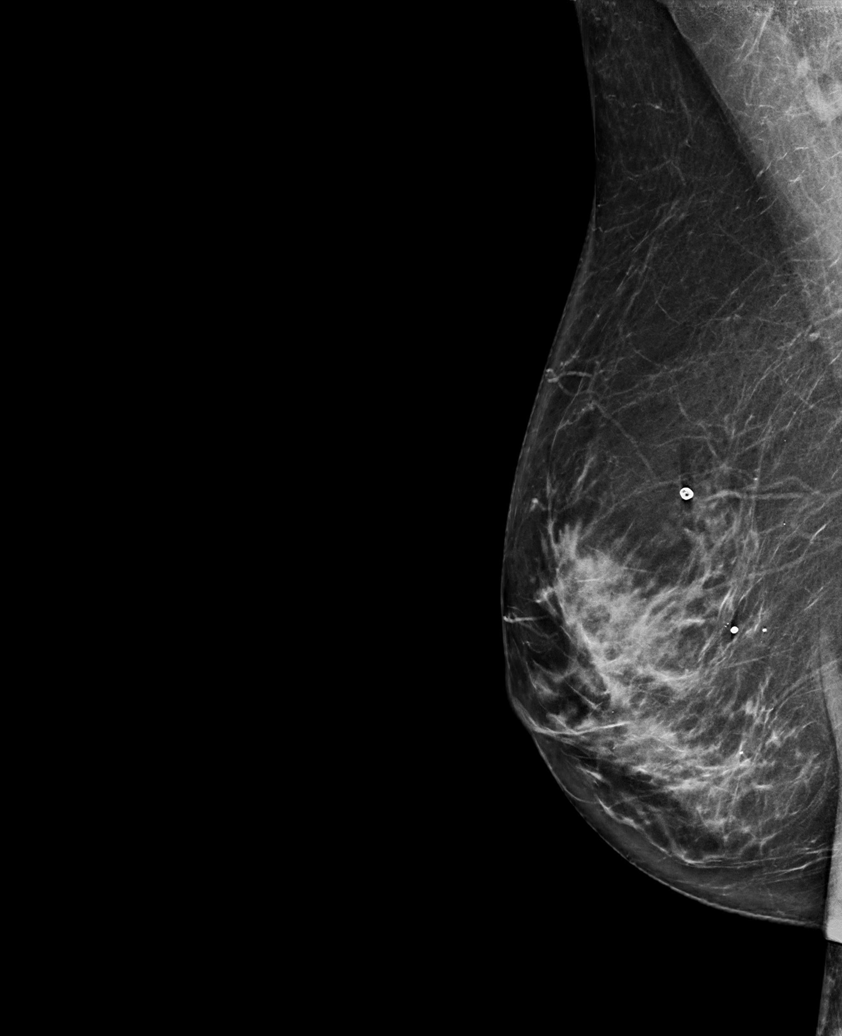

[L CC synth-2D]
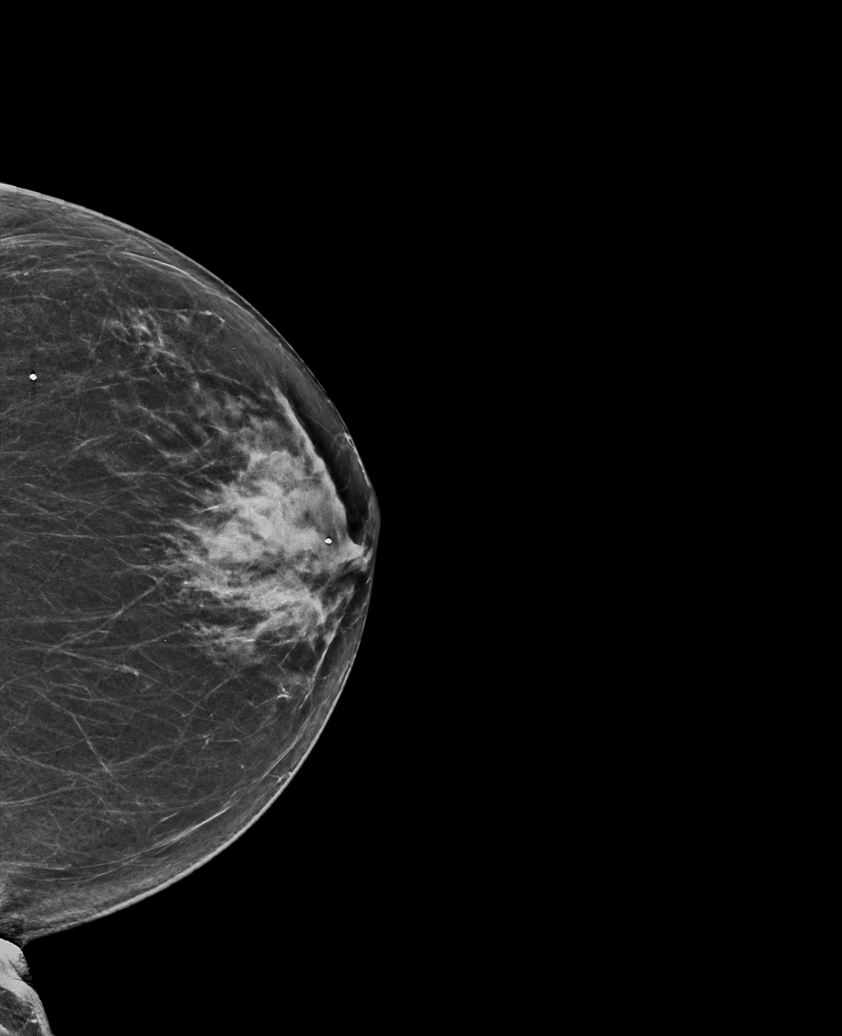

[L MLO synth-2D]
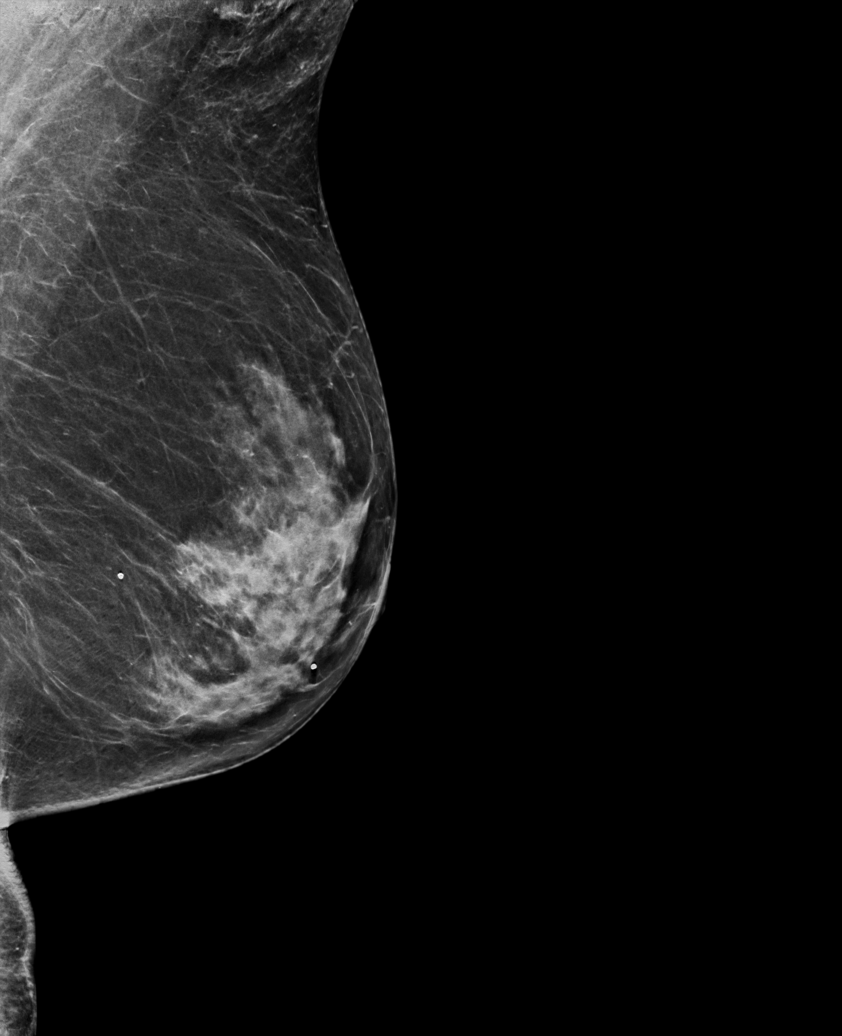

[R CC synth-2D]
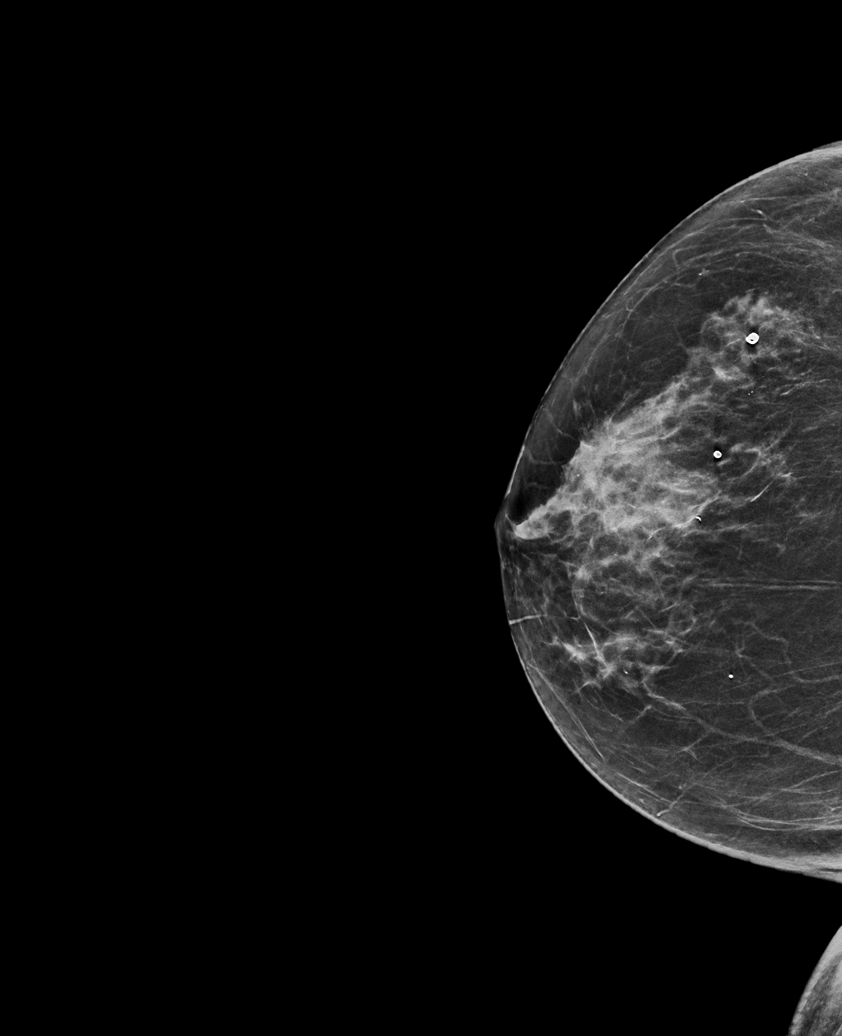

[R CC tomo · tomo slice 31/61.0]
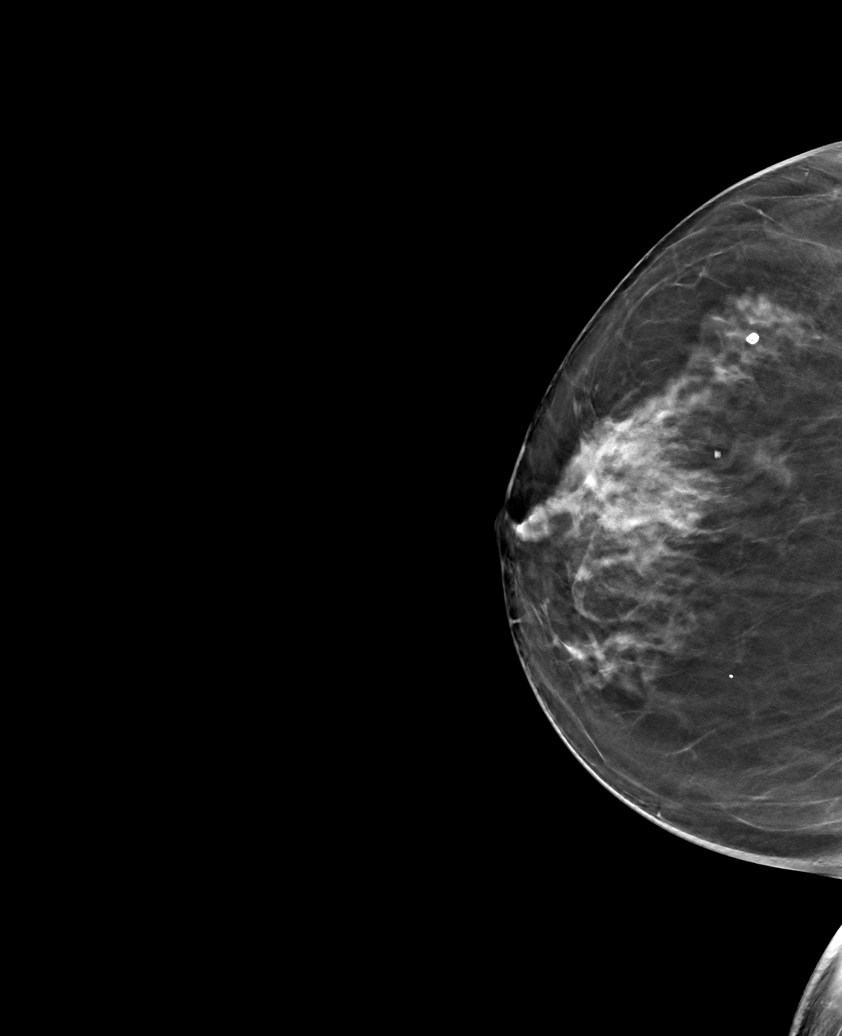

[6 of 30 positions shown; findings below may reference images not displayed]

ACR Breast Density Category c: The breast tissue is heterogeneously
dense, which may obscure small masses.
FINDINGS: There are no findings suspicious for malignancy.
IMPRESSION: No mammographic evidence of malignancy. A result letter of this
screening mammogram will be mailed directly to the patient.

RECOMMENDATION:
Screening mammogram in one year. (Code:[id number]-BC3)

BI-RADS CATEGORY  1: Negative.
